# Patient Record
Sex: Male | Born: 1960 | Race: Black or African American | Hispanic: No | Marital: Married | Smoking: Former smoker
Health system: Southern US, Community
[De-identification: ages and names within clinical notes are randomized; demographics above are authoritative.]

## PROBLEM LIST (undated history)

## (undated) DIAGNOSIS — M5020 Other cervical disc displacement, unspecified cervical region: Secondary | ICD-10-CM

---

## 2016-06-16 ENCOUNTER — Encounter (HOSPITAL_COMMUNITY): Payer: Self-pay | Admitting: Emergency Medicine

## 2016-06-16 ENCOUNTER — Ambulatory Visit (HOSPITAL_COMMUNITY)
Admission: EM | Admit: 2016-06-16 | Discharge: 2016-06-16 | Disposition: A | Discharge status: Home / Self Care | Payer: Managed Care, Other (non HMO) | Attending: Internal Medicine | Admitting: Internal Medicine

## 2016-06-16 DIAGNOSIS — R05 Cough: Secondary | ICD-10-CM

## 2016-06-16 DIAGNOSIS — J208 Acute bronchitis due to other specified organisms: Secondary | ICD-10-CM | POA: Diagnosis not present

## 2016-06-16 DIAGNOSIS — R062 Wheezing: Secondary | ICD-10-CM

## 2016-06-16 DIAGNOSIS — R059 Cough, unspecified: Secondary | ICD-10-CM

## 2016-06-16 HISTORY — DX: Other cervical disc displacement, unspecified cervical region: M50.20

## 2016-06-16 MED ORDER — AZITHROMYCIN 250 MG PO TABS: 250.0000 mg | ORAL_TABLET | Freq: Every day | ORAL | 0 refills | Status: DC

## 2016-06-16 MED ORDER — ALBUTEROL SULFATE HFA 108 (90 BASE) MCG/ACT IN AERS: 2.0000 | INHALATION_SPRAY | RESPIRATORY_TRACT | 0 refills | Status: DC | PRN

## 2016-06-16 MED ORDER — METHYLPREDNISOLONE 4 MG PO TBPK: ORAL_TABLET | ORAL | 0 refills | Status: DC

## 2016-06-16 MED ORDER — BENZONATATE 100 MG PO CAPS: 200.0000 mg | ORAL_CAPSULE | Freq: Three times a day (TID) | ORAL | 0 refills | Status: DC | PRN

## 2016-06-16 NOTE — ED Triage Notes (Signed)
The patient presented to the [facility] with a complaint of a cough and chest congestion x 1 week. 

## 2016-06-16 NOTE — ED Provider Notes (Signed)
CSN: [id number]     Arrival date & time 06/16/16  1822 History   First MD Initiated Contact with Patient 06/16/16 1918     Chief Complaint  Patient presents with  . Cough   (Consider location/radiation/quality/duration/timing/severity/associated sxs/prior Treatment) Patient presents with c/o cough, congestion, wheezing, and uri sx's for over a week.  He quit smoking 3 months ago.   The history is provided by the patient.  URI  Presenting symptoms: congestion, cough, fatigue and rhinorrhea   Presenting symptoms: no sore throat   Severity:  Moderate Onset quality:  Sudden Duration:  1 week Timing:  Constant Progression:  Worsening Chronicity:  New Relieved by:  None tried Worsened by:  Nothing Ineffective treatments:  None tried Associated symptoms: wheezing     Past Medical History:  Diagnosis Date  . Cervical herniated disc    History reviewed. No pertinent surgical history. History reviewed. No pertinent family history. Social History  Substance Use Topics  . Smoking status: Former Games developer  . Smokeless tobacco: Never Used  . Alcohol use Yes    Review of Systems  Constitutional: Positive for fatigue.  HENT: Positive for congestion and rhinorrhea. Negative for sore throat.   Eyes: Negative.   Respiratory: Positive for cough and wheezing.   Cardiovascular: Negative.   Gastrointestinal: Negative.   Endocrine: Negative.   Genitourinary: Negative.   Musculoskeletal: Negative.   Allergic/Immunologic: Negative.   Neurological: Negative.   Hematological: Negative.   Psychiatric/Behavioral: Negative.     Allergies  Patient has no known allergies.  Home Medications   Prior to Admission medications   Medication Sig Start Date End Date Taking? Authorizing Provider  albuterol (PROVENTIL HFA;VENTOLIN HFA) 108 (90 Base) MCG/ACT inhaler Inhale 2 puffs into the lungs every 4 (four) hours as needed for wheezing or shortness of breath. 06/16/16   Deatra Canter, FNP   azithromycin (ZITHROMAX) 250 MG tablet Take 1 tablet (250 mg total) by mouth daily. Take first 2 tablets together, then 1 every day until finished. 06/16/16   Deatra Canter, FNP  benzonatate (TESSALON) 100 MG capsule Take 2 capsules (200 mg total) by mouth 3 (three) times daily as needed for cough. 06/16/16   Deatra Canter, FNP  methylPREDNISolone (MEDROL DOSEPAK) 4 MG TBPK tablet Take 6-5-4-3-2-1 po qd 06/16/16   Deatra Canter, FNP   Meds Ordered and Administered this Visit  Medications - No data to display  BP (!) 162/83 (BP Location: Right Arm)   Pulse 80   Temp 98.5 F (36.9 C) (Oral)   Resp 16   SpO2 97%  No data found.   Physical Exam  Constitutional: He is oriented to person, place, and time. He appears well-developed and well-nourished.  HENT:  Head: Normocephalic and atraumatic.  Right Ear: External ear normal.  Left Ear: External ear normal.  Mouth/Throat: Oropharynx is clear and moist.  Eyes: Conjunctivae and EOM are normal. Pupils are equal, round, and reactive to light.  Neck: Normal range of motion. Neck supple.  Cardiovascular: Normal rate, regular rhythm and normal heart sounds.   Pulmonary/Chest: Effort normal. He has wheezes.  Abdominal: Soft. Bowel sounds are normal.  Musculoskeletal: Normal range of motion.  Neurological: He is alert and oriented to person, place, and time.  Nursing note and vitals reviewed.   Urgent Care Course     Procedures (including critical care time)  Labs Review Labs Reviewed - No data to display  Imaging Review No results found.   Visual Acuity Review  Right Eye Distance:   Left Eye Distance:   Bilateral Distance:    Right Eye Near:   Left Eye Near:    Bilateral Near:         MDM   1. Acute bronchitis due to other specified organisms   2. Cough   3. Wheezing    Medrol dose pack as directed  #21 Zpak Tessalon Perles Albuterol MDI  Push po fluids, rest, tylenol and motrin otc prn as directed for  fever, arthralgias, and myalgias.  Follow up prn if sx's continue or persist.    Deatra Canter, FNP 06/16/16 [phone]

## 2018-09-27 ENCOUNTER — Other Ambulatory Visit: Payer: Self-pay

## 2018-09-27 ENCOUNTER — Encounter (HOSPITAL_COMMUNITY): Payer: Self-pay

## 2018-09-27 ENCOUNTER — Emergency Department (HOSPITAL_COMMUNITY): Payer: No Typology Code available for payment source

## 2018-09-27 ENCOUNTER — Emergency Department (HOSPITAL_COMMUNITY)
Admission: EM | Admit: 2018-09-27 | Discharge: 2018-09-27 | Disposition: A | Discharge status: Home / Self Care | Payer: No Typology Code available for payment source | Attending: Emergency Medicine | Admitting: Emergency Medicine

## 2018-09-27 DIAGNOSIS — M25512 Pain in left shoulder: Secondary | ICD-10-CM | POA: Insufficient documentation

## 2018-09-27 DIAGNOSIS — Z87891 Personal history of nicotine dependence: Secondary | ICD-10-CM | POA: Insufficient documentation

## 2018-09-27 MED ORDER — IBUPROFEN 400 MG PO TABS
600.0000 mg | ORAL_TABLET | Freq: Once | ORAL | Status: AC
  Administered 2018-09-27: 600 mg via ORAL
  Filled 2018-09-27: qty 1

## 2018-09-27 NOTE — ED Triage Notes (Signed)
Pt reports he was "slinging trash' while at work today and heard a pop in his left shoulder, possible dislocation to shoulder. +ROM and pulses palpable.

## 2018-09-27 NOTE — ED Provider Notes (Signed)
[facility] DEPARTMENT Provider Note   CSN: [id number] Arrival date & time: 09/27/18  1850    History   Chief Complaint Chief Complaint  Patient presents with  . Shoulder Injury    HPI Chad Cantrell is a 58 y.o. male with a PMH of a cervical herniated disc presents with left shoulder pain. Patient reports pain started after throwing trash at work. Patient describes pain as an ache and states it is worse with movement. Patient reports pain is very mild at this time. Patient states rest makes symptoms better. Patient denies numbness, paresthesias, weakness, fever, chills, nausea, vomiting, or abdominal pain. Patient denies neck pain, elbow pain, wrist pain, or hand pain. Patient denies joint edema. Patient denies any previous surgeries.      HPI  Past Medical History:  Diagnosis Date  . Cervical herniated disc     There are no active problems to display for this patient.   History reviewed. No pertinent surgical history.      Home Medications    Prior to Admission medications   Medication Sig Start Date End Date Taking? Authorizing Provider  albuterol (PROVENTIL HFA;VENTOLIN HFA) 108 (90 Base) MCG/ACT inhaler Inhale 2 puffs into the lungs every 4 (four) hours as needed for wheezing or shortness of breath. 06/16/16   Lysbeth Penner, FNP  azithromycin (ZITHROMAX) 250 MG tablet Take 1 tablet (250 mg total) by mouth daily. Take first 2 tablets together, then 1 every day until finished. 06/16/16   Lysbeth Penner, FNP  benzonatate (TESSALON) 100 MG capsule Take 2 capsules (200 mg total) by mouth 3 (three) times daily as needed for cough. 06/16/16   Lysbeth Penner, FNP  methylPREDNISolone (MEDROL DOSEPAK) 4 MG TBPK tablet Take 6-5-4-3-2-1 po qd 06/16/16   Lysbeth Penner, FNP    Family History No family history on file.  Social History Social History   Tobacco Use  . Smoking status: Former Research scientist (life sciences)  . Smokeless tobacco: Never Used  Substance  Use Topics  . Alcohol use: Yes  . Drug use: No     Allergies   Patient has no known allergies.   Review of Systems Review of Systems  Constitutional: Negative for activity change, chills, diaphoresis and fever.  Respiratory: Negative for shortness of breath.   Cardiovascular: Negative for leg swelling.  Gastrointestinal: Negative for abdominal pain, diarrhea, nausea and vomiting.  Musculoskeletal: Positive for arthralgias. Negative for back pain, gait problem, joint swelling and myalgias.  Skin: Negative for color change, rash and wound.  Allergic/Immunologic: Negative for immunocompromised state.  Neurological: Negative for weakness and numbness.  Hematological: Does not bruise/bleed easily.     Physical Exam Updated Vital Signs BP 140/87 (BP Location: Right Arm)   Pulse 78   Temp 98.2 F (36.8 C) (Oral)   Resp 18   SpO2 96%   Physical Exam Vitals signs and nursing note reviewed.  Constitutional:      General: He is not in acute distress.    Appearance: He is well-developed. He is not diaphoretic.  HENT:     Head: Normocephalic and atraumatic.  Neck:     Musculoskeletal: Normal range of motion.  Cardiovascular:     Rate and Rhythm: Normal rate and regular rhythm.     Heart sounds: Normal heart sounds. No murmur. No friction rub. No gallop.   Pulmonary:     Effort: Pulmonary effort is normal. No respiratory distress.     Breath sounds: Normal breath sounds. No  wheezing or rales.  Abdominal:     Palpations: Abdomen is soft.     Tenderness: There is no abdominal tenderness.  Musculoskeletal:     Right shoulder: Normal. He exhibits normal range of motion, no tenderness and no bony tenderness.     Left shoulder: He exhibits decreased range of motion, tenderness and bony tenderness. He exhibits no swelling, no effusion, no crepitus, no deformity, no laceration, no pain, no spasm, normal pulse and normal strength.     Left elbow: Normal. He exhibits normal range of  motion, no swelling and no effusion.     Left wrist: Normal. He exhibits normal range of motion, no tenderness and no bony tenderness.     Cervical back: Normal. He exhibits normal range of motion, no tenderness and no bony tenderness.     Comments: No skin changes noted. Anterior shoulder tenderness to palpation. Decreased ROM due to pain. Negative Hawkins and negative drop arm test. 2+ radial pulses. Sensation intact. 5/5 strength in upper extremities.   Skin:    General: Skin is warm.     Findings: No erythema or rash.  Neurological:     Mental Status: He is alert.      ED Treatments / Results  Labs (all labs ordered are listed, but only abnormal results are displayed) Labs Reviewed - No data to display  EKG None  Radiology Dg Shoulder Left  Result Date: 09/27/2018 CLINICAL DATA:  Shoulder pain following heavy lifting, initial encounter EXAM: LEFT SHOULDER - 2+ VIEW COMPARISON:  None. FINDINGS: Mild degenerative changes of the acromioclavicular joint are seen. No acute fracture or dislocation is noted. No soft tissue abnormality is seen. IMPRESSION: No acute abnormality noted. Electronically Signed   By: Alcide CleverMark  Lukens M.D.   On: 09/27/2018 19:28    Procedures Procedures (including critical care time)  Medications Ordered in ED Medications  ibuprofen (ADVIL) tablet 600 mg (600 mg Oral Given 09/27/18 2103)     Initial Impression / Assessment and Plan / ED Course  I have reviewed the triage vital signs and the nursing notes.  Pertinent labs & imaging results that were available during my care of the patient were reviewed by me and considered in my medical decision making (see chart for details).  Clinical Course as of Sep 27 2123  Mon Sep 27, 2018  2021 Mild degenerative changes of the acromioclavicular joint are seen. No acute fracture or dislocation is noted. No soft tissue abnormality is seen.    DG Shoulder Left [AH]    Clinical Course User Index [AH] Leretha DykesHernandez,  Donelda Mailhot P, PA-C      Patient presents with shoulder pain. Patient X-Ray negative for obvious fracture or dislocation. Pain managed in ED. Pt advised to follow up with orthopedics if symptoms persist for possibility of missed fracture diagnosis. Patient given brace while in ED, conservative therapy recommended and discussed. Patient will be dc home & is agreeable with above plan.  Final Clinical Impressions(s) / ED Diagnoses   Final diagnoses:  Acute pain of left shoulder    ED Discharge Orders    None       Glade StanfordHernandez, Dashiell Franchino P, PA-C 09/27/18 2125    Gwyneth SproutPlunkett, Whitney, MD 09/28/18 0002

## 2018-09-27 NOTE — Discharge Instructions (Signed)
1. Medications: alternate ibuprofen and tylenol for pain control, usual home medications 2. Treatment: rest, ice, elevate and use brace, drink plenty of fluids, gentle stretching 3. Follow Up: Please follow up with orthopedics as directed or your PCP in 1 week if no improvement for discussion of your diagnoses and further evaluation after today's visit; if you do not have a primary care doctor use the resource guide provided to find one; Please return to the ER for worsening symptoms or other concerns.  You should return to the ER if you develop severe or worsening symptoms.   Emergency Department Resource Guide 1) Find a Doctor and Pay Out of Pocket Although you won't have to find out who is covered by your insurance plan, it is a good idea to ask around and get recommendations. You will then need to call the office and see if the doctor you have chosen will accept you as a new patient and what types of options they offer for patients who are self-pay. Some doctors offer discounts or will set up payment plans for their patients who do not have insurance, but you will need to ask so you aren't surprised when you get to your appointment.  2) Contact Your Local Health Department Not all health departments have doctors that can see patients for sick visits, but many do, so it is worth a call to see if yours does. If you don't know where your local health department is, you can check in your phone book. The CDC also has a tool to help you locate your state's health department, and many state websites also have listings of all of their local health departments.  3) Find a [facility] If your illness is not likely to be very severe or complicated, you may want to try a walk in clinic. These are popping up all over the country in pharmacies, drugstores, and shopping centers. They're usually staffed by nurse practitioners or physician assistants that have been trained to treat common illnesses and  complaints. They're usually fairly quick and inexpensive. However, if you have serious medical issues or chronic medical problems, these are probably not your best option.  No Primary Care Doctor: Call Health Connect at  [phone] - they can help you locate a primary care doctor that  accepts your insurance, provides certain services, etc. Physician Referral Service- [phone]  Chronic Pain Problems: Organization         Address  Phone   Notes  [facility]  [phone] Patients need to be referred by their primary care doctor.   Medication Assistance: Organization         Address  Phone   Notes  [facility] Medication [facility] [address] [place], [place] [address] [phone] --Must be a resident of [facility] -- Must have NO insurance coverage whatsoever (no Medicaid/ Medicare, etc.) -- The pt. MUST have a primary care doctor that directs their care regularly and follows them in the community   [facility]  [phone]   [employer]  [phone]    Agencies that provide inexpensive medical care: Organization         Address  Phone   Notes  [place]  [phone]   [facility]    [phone]   [facility] [place], [place] [address] [phone]   [place] [phone])  [phone]   [facility]    [phone]   [facility]    [phone]) [phone]   [facility]  [address], [place] Phone:  [phone], Fax:  [phone]) [phone] Hours of Operation:  9 am - 6 pm, M-F.  Also accepts Medicaid/Medicare and self-pay.  [facility]  [address], [place] Phone: [phone]) [phone], Fax: [phone]. Hours of Operation:  8:30 am - 5:30 pm, M-F.  Also accepts Medicaid and self-pay.  [facility] [place] [address],  [place] Phone: [phone]) [phone]   [facility] [address], [place], [place] [phone], Ext. [phone] Mondays & Thursdays: 7-9 AM.  First 15 patients are seen on a first come, first serve basis.    Medicaid-accepting [facility] Providers:  Organization         Address  Phone   Notes  [facility] [address], [place] [phone] Also accepts self-pay patients.  [facility] [address], [place]  [phone]   [facility] [address], [place] [phone]) [phone]   [facility] [address] [place] [address], [place] [phone]   Renaye RakersVeita Bland [address], [place]   [phone] Only accepts [place] patients after they have their name applied to their card.   Self-Pay (no insurance) in [facility] [place]:  Organization         Address  Phone   Notes  Sickle Cell Patients, [facility] [address], [place] [phone]) [phone]   [facility] [address], [place] [phone]) [phone]   [facility] [place]  [address], [place] [phone]   [facility]/Dr. Osei-Bonsu  [address] [place] [address], [place] or [phone], [place] [phone] Phone number for both [place] and [place] locations is the same.  [facility] [address], [place] [phone]) [phone]   [facility] [place] [address] [place] [address], [place] or [address] [phone]   [facility] [address], [place] [phone]) [phone], phone; [phone], fax Sees patients 1st and 3rd Saturday of every month.  Must not qualify for public or private insurance (i.e. Medicaid, Medicare, [place] Health Choice, Veterans' Benefits)  Household income should be no more than 200% of the poverty level The  clinic cannot treat you if you are pregnant or think you are pregnant  Sexually transmitted diseases are not treated at the clinic.

## 2018-09-27 NOTE — ED Notes (Signed)
Pt verbalized understanding of d/c instructions, follow up and medications. Pt ambulatory to waiting room with steady gait, shoulder immobilizer in place.

## 2018-11-08 DIAGNOSIS — M19012 Primary osteoarthritis, left shoulder: Secondary | ICD-10-CM | POA: Insufficient documentation

## 2018-12-09 DIAGNOSIS — M7522 Bicipital tendinitis, left shoulder: Secondary | ICD-10-CM | POA: Insufficient documentation

## 2019-02-02 HISTORY — PX: SHOULDER FUSION: SUR625

## 2019-04-11 ENCOUNTER — Other Ambulatory Visit: Payer: Self-pay

## 2019-04-12 ENCOUNTER — Ambulatory Visit (INDEPENDENT_AMBULATORY_CARE_PROVIDER_SITE_OTHER): Payer: Managed Care, Other (non HMO) | Admitting: Family Medicine

## 2019-04-12 ENCOUNTER — Encounter: Payer: Self-pay | Admitting: Family Medicine

## 2019-04-12 VITALS — BP 148/92 | HR 78 | Temp 97.8°F | Ht 72.0 in | Wt 188.8 lb

## 2019-04-12 DIAGNOSIS — Z0000 Encounter for general adult medical examination without abnormal findings: Secondary | ICD-10-CM | POA: Diagnosis not present

## 2019-04-12 DIAGNOSIS — R030 Elevated blood-pressure reading, without diagnosis of hypertension: Secondary | ICD-10-CM

## 2019-04-12 DIAGNOSIS — M654 Radial styloid tenosynovitis [de Quervain]: Secondary | ICD-10-CM | POA: Insufficient documentation

## 2019-04-12 DIAGNOSIS — Z23 Encounter for immunization: Secondary | ICD-10-CM | POA: Diagnosis not present

## 2019-04-12 DIAGNOSIS — N529 Male erectile dysfunction, unspecified: Secondary | ICD-10-CM

## 2019-04-12 LAB — URINALYSIS, ROUTINE W REFLEX MICROSCOPIC
Bilirubin Urine: NEGATIVE
Ketones, ur: NEGATIVE
Leukocytes,Ua: NEGATIVE
Nitrite: NEGATIVE
Specific Gravity, Urine: >=1.03 — AB (ref 1.000–1.030)
Urine Glucose: NEGATIVE
Urobilinogen, UA: 0.2 (ref 0.0–1.0)
pH: 5 (ref 5.0–8.0)

## 2019-04-12 LAB — COMPREHENSIVE METABOLIC PANEL
ALT: 110 U/L — ABNORMAL HIGH (ref 0–53)
AST: 119 U/L — ABNORMAL HIGH (ref 0–37)
Albumin: 4.7 g/dL (ref 3.5–5.2)
Alkaline Phosphatase: 107 U/L (ref 39–117)
BUN: 12 mg/dL (ref 6–23)
CO2: 21 mEq/L (ref 19–32)
Calcium: 9.8 mg/dL (ref 8.4–10.5)
Chloride: 99 mEq/L (ref 96–112)
Creatinine, Ser: 1.11 mg/dL (ref 0.40–1.50)
GFR: 81.92 mL/min (ref >60.00)
Glucose, Bld: 92 mg/dL (ref 70–99)
Potassium: 3.8 mEq/L (ref 3.5–5.1)
Sodium: 134 mEq/L — ABNORMAL LOW (ref 135–145)
Total Bilirubin: 1.4 mg/dL — ABNORMAL HIGH (ref 0.2–1.2)
Total Protein: 7 g/dL (ref 6.0–8.3)

## 2019-04-12 LAB — LIPID PANEL
Cholesterol: 341 mg/dL — ABNORMAL HIGH (ref 0–200)
HDL: 31.1 mg/dL — ABNORMAL LOW (ref >39.00)
Total CHOL/HDL Ratio: 11
Triglycerides: 1709 mg/dL — ABNORMAL HIGH (ref 0.0–149.0)

## 2019-04-12 LAB — CBC
HCT: 44.9 % (ref 39.0–52.0)
Hemoglobin: 15.3 g/dL (ref 13.0–17.0)
MCHC: 34 g/dL (ref 30.0–36.0)
MCV: 89 fl (ref 78.0–100.0)
Platelets: 258 10*3/uL (ref 150.0–400.0)
RBC: 5.05 Mil/uL (ref 4.22–5.81)
RDW: 13.3 % (ref 11.5–15.5)
WBC: 8.2 10*3/uL (ref 4.0–10.5)

## 2019-04-12 LAB — PSA: PSA: 4.28 ng/mL — ABNORMAL HIGH (ref 0.10–4.00)

## 2019-04-12 LAB — LDL CHOLESTEROL, DIRECT: Direct LDL: 64 mg/dL

## 2019-04-12 LAB — TSH: TSH: 1.5 u[IU]/mL (ref 0.35–4.50)

## 2019-04-12 MED ORDER — SILDENAFIL CITRATE 20 MG PO TABS: ORAL_TABLET | ORAL | 0 refills | Status: AC

## 2019-04-12 NOTE — Patient Instructions (Signed)
Health Maintenance, Male Adopting a healthy lifestyle and getting preventive care are important in promoting health and wellness. Ask your health care provider about:  The right schedule for you to have regular tests and exams.  Things you can do on your own to prevent diseases and keep yourself healthy. What should I know about diet, weight, and exercise? Eat a healthy diet   Eat a diet that includes plenty of vegetables, fruits, low-fat dairy products, and lean protein.  Do not eat a lot of foods that are high in solid fats, added sugars, or sodium. Maintain a healthy weight Body mass index (BMI) is a measurement that can be used to identify possible weight problems. It estimates body fat based on height and weight. Your health care provider can help determine your BMI and help you achieve or maintain a healthy weight. Get regular exercise Get regular exercise. This is one of the most important things you can do for your health. Most adults should:  Exercise for at least 150 minutes each week. The exercise should increase your heart rate and make you sweat (moderate-intensity exercise).  Do strengthening exercises at least twice a week. This is in addition to the moderate-intensity exercise.  Spend less time sitting. Even light physical activity can be beneficial. Watch cholesterol and blood lipids Have your blood tested for lipids and cholesterol at 60 years of age, then have this test every 5 years. You may need to have your cholesterol levels checked more often if:  Your lipid or cholesterol levels are high.  You are older than 60 years of age.  You are at high risk for heart disease. What should I know about cancer screening? Many types of cancers can be detected early and may often be prevented. Depending on your health history and family history, you may need to have cancer screening at various ages. This may include screening for:  Colorectal cancer.  Prostate  cancer.  Skin cancer.  Lung cancer. What should I know about heart disease, diabetes, and high blood pressure? Blood pressure and heart disease  High blood pressure causes heart disease and increases the risk of stroke. This is more likely to develop in people who have high blood pressure readings, are of African descent, or are overweight.  Talk with your health care provider about your target blood pressure readings.  Have your blood pressure checked: ? Every 3-5 years if you are 18-39 years of age. ? Every year if you are 40 years old or older.  If you are between the ages of 65 and 75 and are a current or former smoker, ask your health care provider if you should have a one-time screening for abdominal aortic aneurysm (AAA). Diabetes Have regular diabetes screenings. This checks your fasting blood sugar level. Have the screening done:  Once every three years after age 45 if you are at a normal weight and have a low risk for diabetes.  More often and at a younger age if you are overweight or have a high risk for diabetes. What should I know about preventing infection? Hepatitis B If you have a higher risk for hepatitis B, you should be screened for this virus. Talk with your health care provider to find out if you are at risk for hepatitis B infection. Hepatitis C Blood testing is recommended for:  Everyone born from 1945 through 1965.  Anyone with known risk factors for hepatitis C. Sexually transmitted infections (STIs)  You should be screened each year   for STIs, including gonorrhea and chlamydia, if: ? You are sexually active and are younger than 60 years of age. ? You are older than 60 years of age and your health care provider tells you that you are at risk for this type of infection. ? Your sexual activity has changed since you were last screened, and you are at increased risk for chlamydia or gonorrhea. Ask your health care provider if you are at risk.  Ask your  health care provider about whether you are at high risk for HIV. Your health care provider may recommend a prescription medicine to help prevent HIV infection. If you choose to take medicine to prevent HIV, you should first get tested for HIV. You should then be tested every 3 months for as long as you are taking the medicine. Follow these instructions at home: Lifestyle  Do not use any products that contain nicotine or tobacco, such as cigarettes, e-cigarettes, and chewing tobacco. If you need help quitting, ask your health care provider.  Do not use street drugs.  Do not share needles.  Ask your health care provider for help if you need support or information about quitting drugs. Alcohol use  Do not drink alcohol if your health care provider tells you not to drink.  If you drink alcohol: ? Limit how much you have to 0-2 drinks a day. ? Be aware of how much alcohol is in your drink. In the [place] one drink equals one 12 oz bottle of beer (355 mL), one 5 oz glass of wine (148 mL), or one 1 oz glass of hard liquor (44 mL). General instructions  Schedule regular health, dental, and eye exams.  Stay current with your vaccines.  Tell your health care provider if: ? You often feel depressed. ? You have ever been abused or do not feel safe at home. Summary  Adopting a healthy lifestyle and getting preventive care are important in promoting health and wellness.  Follow your health care provider's instructions about healthy diet, exercising, and getting tested or screened for diseases.  Follow your health care provider's instructions on monitoring your cholesterol and blood pressure. This information is not intended to replace advice given to you by your health care provider. Make sure you discuss any questions you have with your health care provider. Document Revised: 02/17/2018 Document Reviewed: 02/17/2018 Elsevier Patient Education  [place].  Erectile  Dysfunction Erectile dysfunction (ED) is the inability to get or keep an erection in order to have sexual intercourse. Erectile dysfunction may include:  Inability to get an erection.  Lack of enough hardness of the erection to allow penetration.  Loss of the erection before sex is finished. What are the causes? This condition may be caused by:  Certain medicines, such as: ? Pain relievers. ? Antihistamines. ? Antidepressants. ? Blood pressure medicines. ? Water pills (diuretics). ? Ulcer medicines. ? Muscle relaxants. ? Drugs.  Excessive drinking.  Psychological causes, such as: ? Anxiety. ? Depression. ? Sadness. ? Exhaustion. ? Performance fear. ? Stress.  Physical causes, such as: ? Artery problems. This may include diabetes, smoking, liver disease, or atherosclerosis. ? High blood pressure. ? Hormonal problems, such as low testosterone. ? Obesity. ? Nerve problems. This may include back or pelvic injuries, diabetes mellitus, multiple sclerosis, or Parkinson disease. What are the signs or symptoms? Symptoms of this condition include:  Inability to get an erection.  Lack of enough hardness of the erection to allow penetration.  Loss of the  erection before sex is finished.  Normal erections at some times, but with frequent unsatisfactory episodes.  Low sexual satisfaction in either partner due to erection problems.  A curved penis occurring with erection. The curve may cause pain or the penis may be too curved to allow for intercourse.  Never having nighttime erections. How is this diagnosed? This condition is often diagnosed by:  Performing a physical exam to find other diseases or specific problems with the penis.  Asking you detailed questions about the problem.  Performing blood tests to check for diabetes mellitus or to measure hormone levels.  Performing other tests to check for underlying health conditions.  Performing an ultrasound exam to  check for scarring.  Performing a test to check blood flow to the penis.  Doing a sleep study at home to measure nighttime erections. How is this treated? This condition may be treated by:  Medicine taken by mouth to help you achieve an erection (oral medicine).  Hormone replacement therapy to replace low testosterone levels.  Medicine that is injected into the penis. Your health care provider may instruct you how to give yourself these injections at home.  Vacuum pump. This is a pump with a ring on it. The pump and ring are placed on the penis and used to create pressure that helps the penis become erect.  Penile implant surgery. In this procedure, you may receive: ? An inflatable implant. This consists of cylinders, a pump, and a reservoir. The cylinders can be inflated with a fluid that helps to create an erection, and they can be deflated after intercourse. ? A semi-rigid implant. This consists of two silicone rubber rods. The rods provide some rigidity. They are also flexible, so the penis can both curve downward in its normal position and become straight for sexual intercourse.  Blood vessel surgery, to improve blood flow to the penis. During this procedure, a blood vessel from a different part of the body is placed into the penis to allow blood to flow around (bypass) damaged or blocked blood vessels.  Lifestyle changes, such as exercising more, losing weight, and quitting smoking. Follow these instructions at home: Medicines   Take over-the-counter and prescription medicines only as told by your health care provider. Do not increase the dosage without first discussing it with your health care provider.  If you are using self-injections, perform injections as directed by your health care provider. Make sure to avoid any veins that are on the surface of the penis. After giving an injection, apply pressure to the injection site for 5 minutes. General instructions  Exercise  regularly, as directed by your health care provider. Work with your health care provider to lose weight, if needed.  Do not use any products that contain nicotine or tobacco, such as cigarettes and e-cigarettes. If you need help quitting, ask your health care provider.  Before using a vacuum pump, read the instructions that come with the pump and discuss any questions with your health care provider.  Keep all follow-up visits as told by your health care provider. This is important. Contact a health care provider if:  You feel nauseous.  You vomit. Get help right away if:  You are taking oral or injectable medicines and you have an erection that lasts longer than 4 hours. If your health care provider is unavailable, go to the nearest emergency room for evaluation. An erection that lasts much longer than 4 hours can result in permanent damage to your penis.  You have severe pain in your groin or abdomen.  You develop redness or severe swelling of your penis.  You have redness spreading up into your groin or lower abdomen.  You are unable to urinate.  You experience chest pain or a rapid heart beat (palpitations) after taking oral medicines. Summary  Erectile dysfunction (ED) is the inability to get or keep an erection during sexual intercourse. This problem can usually be treated successfully.  This condition is diagnosed based on a physical exam, your symptoms, and tests to determine the cause. Treatment varies depending on the cause, and may include medicines, hormone therapy, surgery, or vacuum pump.  You may need follow-up visits to make sure that you are using your medicines or devices correctly.  Get help right away if you are taking or injecting medicines and you have an erection that lasts longer than 4 hours. This information is not intended to replace advice given to you by your health care provider. Make sure you discuss any questions you have with your health care  provider. Document Revised: 02/06/2017 Document Reviewed: 03/12/2016 Elsevier Patient Education  [place].  Hypertension, Adult Hypertension is another name for high blood pressure. High blood pressure forces your heart to work harder to pump blood. This can cause problems over time. There are two numbers in a blood pressure reading. There is a top number (systolic) over a bottom number (diastolic). It is best to have a blood pressure that is below 120/80. Healthy choices can help lower your blood pressure, or you may need medicine to help lower it. What are the causes? The cause of this condition is not known. Some conditions may be related to high blood pressure. What increases the risk?  Smoking.  Having type 2 diabetes mellitus, high cholesterol, or both.  Not getting enough exercise or physical activity.  Being overweight.  Having too much fat, sugar, calories, or salt (sodium) in your diet.  Drinking too much alcohol.  Having long-term (chronic) kidney disease.  Having a family history of high blood pressure.  Age. Risk increases with age.  Race. You may be at higher risk if you are African American.  Gender. Men are at higher risk than women before age 36. After age 44, women are at higher risk than men.  Having obstructive sleep apnea.  Stress. What are the signs or symptoms?  High blood pressure may not cause symptoms. Very high blood pressure (hypertensive crisis) may cause: ? Headache. ? Feelings of worry or nervousness (anxiety). ? Shortness of breath. ? Nosebleed. ? A feeling of being sick to your stomach (nausea). ? Throwing up (vomiting). ? Changes in how you see. ? Very bad chest pain. ? Seizures. How is this treated?  This condition is treated by making healthy lifestyle changes, such as: ? Eating healthy foods. ? Exercising more. ? Drinking less alcohol.  Your health care provider may prescribe medicine if lifestyle changes are not  enough to get your blood pressure under control, and if: ? Your top number is above 130. ? Your bottom number is above 80.  Your personal target blood pressure may vary. Follow these instructions at home: Eating and drinking   If told, follow the DASH eating plan. To follow this plan: ? Fill one half of your plate at each meal with fruits and vegetables. ? Fill one fourth of your plate at each meal with whole grains. Whole grains include whole-wheat pasta, brown rice, and whole-grain bread. ? Eat or drink  low-fat dairy products, such as skim milk or low-fat yogurt. ? Fill one fourth of your plate at each meal with low-fat (lean) proteins. Low-fat proteins include fish, chicken without skin, eggs, beans, and tofu. ? Avoid fatty meat, cured and processed meat, or chicken with skin. ? Avoid pre-made or processed food.  Eat less than 1,500 mg of salt each day.  Do not drink alcohol if: ? Your doctor tells you not to drink. ? You are pregnant, may be pregnant, or are planning to become pregnant.  If you drink alcohol: ? Limit how much you use to:  0-1 drink a day for women.  0-2 drinks a day for men. ? Be aware of how much alcohol is in your drink. In the [place] one drink equals one 12 oz bottle of beer (355 mL), one 5 oz glass of wine (148 mL), or one 1 oz glass of hard liquor (44 mL). Lifestyle   Work with your doctor to stay at a healthy weight or to lose weight. Ask your doctor what the best weight is for you.  Get at least 30 minutes of exercise most days of the week. This may include walking, swimming, or biking.  Get at least 30 minutes of exercise that strengthens your muscles (resistance exercise) at least 3 days a week. This may include lifting weights or doing Pilates.  Do not use any products that contain nicotine or tobacco, such as cigarettes, e-cigarettes, and chewing tobacco. If you need help quitting, ask your doctor.  Check your blood pressure at home as told by  your doctor.  Keep all follow-up visits as told by your doctor. This is important. Medicines  Take over-the-counter and prescription medicines only as told by your doctor. Follow directions carefully.  Do not skip doses of blood pressure medicine. The medicine does not work as well if you skip doses. Skipping doses also puts you at risk for problems.  Ask your doctor about side effects or reactions to medicines that you should watch for. Contact a doctor if you:  Think you are having a reaction to the medicine you are taking.  Have headaches that keep coming back (recurring).  Feel dizzy.  Have swelling in your ankles.  Have trouble with your vision. Get help right away if you:  Get a very bad headache.  Start to feel mixed up (confused).  Feel weak or numb.  Feel faint.  Have very bad pain in your: ? Chest. ? Belly (abdomen).  Throw up more than once.  Have trouble breathing. Summary  Hypertension is another name for high blood pressure.  High blood pressure forces your heart to work harder to pump blood.  For most people, a normal blood pressure is less than 120/80.  Making healthy choices can help lower blood pressure. If your blood pressure does not get lower with healthy choices, you may need to take medicine. This information is not intended to replace advice given to you by your health care provider. Make sure you discuss any questions you have with your health care provider. Document Revised: 11/04/2017 Document Reviewed: 11/04/2017 Elsevier Patient Education  [place].  Preventing Hypertension Hypertension, commonly called high blood pressure, is when the force of blood pumping through the arteries is too strong. Arteries are blood vessels that carry blood from the heart throughout the body. Over time, hypertension can damage the arteries and decrease blood flow to important parts of the body, including the brain, heart, and kidneys. Often,  hypertension does not cause symptoms until blood pressure is very high. For this reason, it is important to have your blood pressure checked on a regular basis. Hypertension can often be prevented with diet and lifestyle changes. If you already have hypertension, you can control it with diet and lifestyle changes, as well as medicine. What nutrition changes can be made? Maintain a healthy diet. This includes:  Eating less salt (sodium). Ask your health care provider how much sodium is safe for you to have. The general recommendation is to consume less than 1 tsp (2,300 mg) of sodium a day. ? Do not add salt to your food. ? Choose low-sodium options when grocery shopping and eating out.  Limiting fats in your diet. You can do this by eating low-fat or fat-free dairy products and by eating less red meat.  Eating more fruits, vegetables, and whole grains. Make a goal to eat: ? 1-2 cups of fresh fruits and vegetables each day. ? 3-4 servings of whole grains each day.  Avoiding foods and beverages that have added sugars.  Eating fish that contain healthy fats (omega-3 fatty acids), such as mackerel or salmon. If you need help putting together a healthy eating plan, try the DASH diet. This diet is high in fruits, vegetables, and whole grains. It is low in sodium, red meat, and added sugars. DASH stands for Dietary Approaches to Stop Hypertension. What lifestyle changes can be made?   Lose weight if you are overweight. Losing just 3?5% of your body weight can help prevent or control hypertension. ? For example, if your present weight is 200 lb (91 kg), a loss of 3-5% of your weight means losing 6-10 lb (2.7-4.5 kg). ? Ask your health care provider to help you with a diet and exercise plan to safely lose weight.  Get enough exercise. Do at least 150 minutes of moderate-intensity exercise each week. ? You could do this in short exercise sessions several times a day, or you could do longer exercise  sessions a few times a week. For example, you could take a brisk 10-minute walk or bike ride, 3 times a day, for 5 days a week.  Find ways to reduce stress, such as exercising, meditating, listening to music, or taking a yoga class. If you need help reducing stress, ask your health care provider.  Do not smoke. This includes e-cigarettes. Chemicals in tobacco and nicotine products raise your blood pressure each time you smoke. If you need help quitting, ask your health care provider.  Avoid alcohol. If you drink alcohol, limit alcohol intake to no more than 1 drink a day for nonpregnant women and 2 drinks a day for men. One drink equals 12 oz of beer, 5 oz of wine, or 1 oz of hard liquor. Why are these changes important? Diet and lifestyle changes can help you prevent hypertension, and they may make you feel better overall and improve your quality of life. If you have hypertension, making these changes will help you control it and help prevent major complications, such as:  Hardening and narrowing of arteries that supply blood to: ? Your heart. This can cause a heart attack. ? Your brain. This can cause a stroke. ? Your kidneys. This can cause kidney failure.  Stress on your heart muscle, which can cause heart failure. What can I do to lower my risk?  Work with your health care provider to make a hypertension prevention plan that works for you. Follow your plan and keep  all follow-up visits as told by your health care provider.  Learn how to check your blood pressure at home. Make sure that you know your personal target blood pressure, as told by your health care provider. How is this treated? In addition to diet and lifestyle changes, your health care provider may recommend medicines to help lower your blood pressure. You may need to try a few different medicines to find what works best for you. You also may need to take more than one medicine. Take over-the-counter and prescription medicines  only as told by your health care provider. Where to find support Your health care provider can help you prevent hypertension and help you keep your blood pressure at a healthy level. Your local hospital or your community may also provide support services and prevention programs. The American Heart Association offers an online support network at: CheapBootlegs.com.cy Where to find more information Learn more about hypertension from:  [place]: ElectronicHangman.is  Centers for Disease Control and Prevention: https://ingram.com/  American Academy of Family Physicians: http://familydoctor.org/familydoctor/en/diseases-conditions/high-blood-pressure.printerview.all.html Learn more about the DASH diet from:  [place], Lung, and [place]: https://www.reyes.com/ Contact a health care provider if:  You think you are having a reaction to medicines you have taken.  You have recurrent headaches or feel dizzy.  You have swelling in your ankles.  You have trouble with your vision. Summary  Hypertension often does not cause any symptoms until blood pressure is very high. It is important to get your blood pressure checked regularly.  Diet and lifestyle changes are the most important steps in preventing hypertension.  By keeping your blood pressure in a healthy range, you can prevent complications like heart attack, heart failure, stroke, and kidney failure.  Work with your health care provider to make a hypertension prevention plan that works for you. This information is not intended to replace advice given to you by your health care provider. Make sure you discuss any questions you have with your health care provider. Document Revised: 06/18/2018 Document Reviewed: 11/05/2015 Elsevier Patient Education  [place].  Erectile Dysfunction Erectile  dysfunction (ED) is the inability to get or keep an erection in order to have sexual intercourse. Erectile dysfunction may include:  Inability to get an erection.  Lack of enough hardness of the erection to allow penetration.  Loss of the erection before sex is finished. What are the causes? This condition may be caused by:  Certain medicines, such as: ? Pain relievers. ? Antihistamines. ? Antidepressants. ? Blood pressure medicines. ? Water pills (diuretics). ? Ulcer medicines. ? Muscle relaxants. ? Drugs.  Excessive drinking.  Psychological causes, such as: ? Anxiety. ? Depression. ? Sadness. ? Exhaustion. ? Performance fear. ? Stress.  Physical causes, such as: ? Artery problems. This may include diabetes, smoking, liver disease, or atherosclerosis. ? High blood pressure. ? Hormonal problems, such as low testosterone. ? Obesity. ? Nerve problems. This may include back or pelvic injuries, diabetes mellitus, multiple sclerosis, or Parkinson disease. What are the signs or symptoms? Symptoms of this condition include:  Inability to get an erection.  Lack of enough hardness of the erection to allow penetration.  Loss of the erection before sex is finished.  Normal erections at some times, but with frequent unsatisfactory episodes.  Low sexual satisfaction in either partner due to erection problems.  A curved penis occurring with erection. The curve may cause pain or the penis may be too curved to allow for intercourse.  Never having nighttime erections. How is this diagnosed? This condition is often diagnosed by:  Performing a physical exam to find other diseases or specific problems with the penis.  Asking you detailed questions about the problem.  Performing blood tests to check for diabetes mellitus or to measure hormone levels.  Performing other tests to check for underlying health conditions.  Performing an ultrasound exam to check for  scarring.  Performing a test to check blood flow to the penis.  Doing a sleep study at home to measure nighttime erections. How is this treated? This condition may be treated by:  Medicine taken by mouth to help you achieve an erection (oral medicine).  Hormone replacement therapy to replace low testosterone levels.  Medicine that is injected into the penis. Your health care provider may instruct you how to give yourself these injections at home.  Vacuum pump. This is a pump with a ring on it. The pump and ring are placed on the penis and used to create pressure that helps the penis become erect.  Penile implant surgery. In this procedure, you may receive: ? An inflatable implant. This consists of cylinders, a pump, and a reservoir. The cylinders can be inflated with a fluid that helps to create an erection, and they can be deflated after intercourse. ? A semi-rigid implant. This consists of two silicone rubber rods. The rods provide some rigidity. They are also flexible, so the penis can both curve downward in its normal position and become straight for sexual intercourse.  Blood vessel surgery, to improve blood flow to the penis. During this procedure, a blood vessel from a different part of the body is placed into the penis to allow blood to flow around (bypass) damaged or blocked blood vessels.  Lifestyle changes, such as exercising more, losing weight, and quitting smoking. Follow these instructions at home: Medicines   Take over-the-counter and prescription medicines only as told by your health care provider. Do not increase the dosage without first discussing it with your health care provider.  If you are using self-injections, perform injections as directed by your health care provider. Make sure to avoid any veins that are on the surface of the penis. After giving an injection, apply pressure to the injection site for 5 minutes. General instructions  Exercise regularly, as  directed by your health care provider. Work with your health care provider to lose weight, if needed.  Do not use any products that contain nicotine or tobacco, such as cigarettes and e-cigarettes. If you need help quitting, ask your health care provider.  Before using a vacuum pump, read the instructions that come with the pump and discuss any questions with your health care provider.  Keep all follow-up visits as told by your health care provider. This is important. Contact a health care provider if:  You feel nauseous.  You vomit. Get help right away if:  You are taking oral or injectable medicines and you have an erection that lasts longer than 4 hours. If your health care provider is unavailable, go to the nearest emergency room for evaluation. An erection that lasts much longer than 4 hours can result in permanent damage to your penis.  You have severe pain in your groin or abdomen.  You develop redness or severe swelling of your penis.  You have redness spreading up into your groin or lower abdomen.  You are unable to urinate.  You experience chest pain or a rapid heart beat (palpitations) after taking oral  medicines. Summary  Erectile dysfunction (ED) is the inability to get or keep an erection during sexual intercourse. This problem can usually be treated successfully.  This condition is diagnosed based on a physical exam, your symptoms, and tests to determine the cause. Treatment varies depending on the cause, and may include medicines, hormone therapy, surgery, or vacuum pump.  You may need follow-up visits to make sure that you are using your medicines or devices correctly.  Get help right away if you are taking or injecting medicines and you have an erection that lasts longer than 4 hours. This information is not intended to replace advice given to you by your health care provider. Make sure you discuss any questions you have with your health care provider. Document  Revised: 02/06/2017 Document Reviewed: 03/12/2016 Elsevier Patient Education  2020 Else[employer] Inc.  Preventive Care 69-99 Years Old, Male Preventive care refers to lifestyle choices and visits with your health care provider that can promote health and wellness. This includes:  A yearly physical exam. This is also called an annual well check.  Regular dental and eye exams.  Immunizations.  Screening for certain conditions.  Healthy lifestyle choices, such as eating a healthy diet, getting regular exercise, not using drugs or products that contain nicotine and tobacco, and limiting alcohol use. What can I expect for my preventive care visit? Physical exam Your health care provider will check:  Height and weight. These may be used to calculate body mass index (BMI), which is a measurement that tells if you are at a healthy weight.  Heart rate and blood pressure.  Your skin for abnormal spots. Counseling Your health care provider may ask you questions about:  Alcohol, tobacco, and drug use.  Emotional well-being.  Home and relationship well-being.  Sexual activity.  Eating habits.  Work and work Statistician. What immunizations do I need?  Influenza (flu) vaccine  This is recommended every year. Tetanus, diphtheria, and pertussis (Tdap) vaccine  You may need a Td booster every 10 years. Varicella (chickenpox) vaccine  You may need this vaccine if you have not already been vaccinated. Zoster (shingles) vaccine  You may need this after age 53. Measles, mumps, and rubella (MMR) vaccine  You may need at least one dose of MMR if you were born in 1957 or later. You may also need a second dose. Pneumococcal conjugate (PCV13) vaccine  You may need this if you have certain conditions and were not previously vaccinated. Pneumococcal polysaccharide (PPSV23) vaccine  You may need one or two doses if you smoke cigarettes or if you have certain conditions. Meningococcal  conjugate (MenACWY) vaccine  You may need this if you have certain conditions. Hepatitis A vaccine  You may need this if you have certain conditions or if you travel or work in places where you may be exposed to hepatitis A. Hepatitis B vaccine  You may need this if you have certain conditions or if you travel or work in places where you may be exposed to hepatitis B. Haemophilus influenzae type b (Hib) vaccine  You may need this if you have certain risk factors. Human papillomavirus (HPV) vaccine  If recommended by your health care provider, you may need three doses over 6 months. You may receive vaccines as individual doses or as more than one vaccine together in one shot (combination vaccines). Talk with your health care provider about the risks and benefits of combination vaccines. What tests do I need? Blood tests  Lipid and cholesterol levels.  These may be checked every 5 years, or more frequently if you are over 40 years old.  Hepatitis C test.  Hepatitis B test. Screening  Lung cancer screening. You may have this screening every year starting at age 23 if you have a 30-pack-year history of smoking and currently smoke or have quit within the past 15 years.  Prostate cancer screening. Recommendations will vary depending on your family history and other risks.  Colorectal cancer screening. All adults should have this screening starting at age 72 and continuing until age 6. Your health care provider may recommend screening at age 12 if you are at increased risk. You will have tests every 1-10 years, depending on your results and the type of screening test.  Diabetes screening. This is done by checking your blood sugar (glucose) after you have not eaten for a while (fasting). You may have this done every 1-3 years.  Sexually transmitted disease (STD) testing. Follow these instructions at home: Eating and drinking  Eat a diet that includes fresh fruits and vegetables, whole  grains, lean protein, and low-fat dairy products.  Take vitamin and mineral supplements as recommended by your health care provider.  Do not drink alcohol if your health care provider tells you not to drink.  If you drink alcohol: ? Limit how much you have to 0-2 drinks a day. ? Be aware of how much alcohol is in your drink. In the [place] one drink equals one 12 oz bottle of beer (355 mL), one 5 oz glass of wine (148 mL), or one 1 oz glass of hard liquor (44 mL). Lifestyle  Take daily care of your teeth and gums.  Stay active. Exercise for at least 30 minutes on 5 or more days each week.  Do not use any products that contain nicotine or tobacco, such as cigarettes, e-cigarettes, and chewing tobacco. If you need help quitting, ask your health care provider.  If you are sexually active, practice safe sex. Use a condom or other form of protection to prevent STIs (sexually transmitted infections).  Talk with your health care provider about taking a low-dose aspirin every day starting at age 27. What's next?  Go to your health care provider once a year for a well check visit.  Ask your health care provider how often you should have your eyes and teeth checked.  Stay up to date on all vaccines. This information is not intended to replace advice given to you by your health care provider. Make sure you discuss any questions you have with your health care provider. Document Revised: 02/18/2018 Document Reviewed: 02/18/2018 Elsevier Patient Education  2020 [employer].

## 2019-04-12 NOTE — Progress Notes (Signed)
New Patient Office Visit  Subjective:  Patient ID: Chad Cantrell, male    DOB: 14-Aug-1960  Age: 59 y.o. MRN: [id number]  CC:  Chief Complaint  Patient presents with  . Establish Care    personal concerns pt would like to discuss     HPI Chad Cantrell presents for establishment of care a physical exam and to discuss few problems.  Has been having issues with erectile dysfunction over the last year.  Difficulty achieving erection.  Libido is good.  Has no history of hypertension that he is aware of.  He is currently out of work recuperating from rotator cuff repair on his left shoulder this past November.  Recently moved into this area with his wife to be closer to their children.  He normally works in Production designer, theatre/television/film.  Saw the dentist this past week and had several teeth pulled.  He quit smoking 1 year ago.  He drinks 1-2 beers weekly.  He rarely smokes marijuana.  Had a colonoscopy 5 years ago that did show a polyp.  Past Medical History:  Diagnosis Date  . Cervical herniated disc     Past Surgical History:  Procedure Laterality Date  . SHOULDER FUSION Left 02/02/2019    Family History  Problem Relation Age of Onset  . Kidney disease Mother     Social History   Socioeconomic History  . Marital status: Married    Spouse name: Not on file  . Number of children: Not on file  . Years of education: Not on file  . Highest education level: Not on file  Occupational History  . Not on file  Tobacco Use  . Smoking status: Former Smoker    Types: Cigarettes  . Smokeless tobacco: Never Used  Substance and Sexual Activity  . Alcohol use: Yes    Alcohol/week: 1.0 - 2.0 standard drinks    Types: 1 - 2 Cans of beer per week    Comment: social  . Drug use: Yes    Types: Marijuana  . Sexual activity: Not on file  Other Topics Concern  . Not on file  Social History Narrative  . Not on file   Social Determinants of Health   Financial Resource Strain:   . Difficulty of Paying  Living Expenses: Not on file  Food Insecurity:   . Worried About Programme researcher, broadcasting/film/video in the Last Year: Not on file  . Ran Out of Food in the Last Year: Not on file  Transportation Needs:   . Lack of Transportation (Medical): Not on file  . Lack of Transportation (Non-Medical): Not on file  Physical Activity:   . Days of Exercise per Week: Not on file  . Minutes of Exercise per Session: Not on file  Stress:   . Feeling of Stress : Not on file  Social Connections:   . Frequency of Communication with Friends and Family: Not on file  . Frequency of Social Gatherings with Friends and Family: Not on file  . Attends Religious Services: Not on file  . Active Member of Clubs or Organizations: Not on file  . Attends Banker Meetings: Not on file  . Marital Status: Not on file  Intimate Partner Violence:   . Fear of Current or Ex-Partner: Not on file  . Emotionally Abused: Not on file  . Physically Abused: Not on file  . Sexually Abused: Not on file    ROS Review of Systems  Constitutional: Negative for diaphoresis, fatigue, fever and  unexpected weight change.  HENT: Negative.   Eyes: Negative for photophobia and visual disturbance.  Respiratory: Negative.  Negative for chest tightness and shortness of breath.   Cardiovascular: Negative.   Gastrointestinal: Negative.   Endocrine: Negative for polyphagia and polyuria.  Genitourinary: Negative for difficulty urinating, frequency and urgency.  Musculoskeletal: Positive for arthralgias.  Skin: Negative for pallor.  Allergic/Immunologic: Negative for immunocompromised state.  Neurological: Negative for tremors and speech difficulty.  Hematological: Does not bruise/bleed easily.  Psychiatric/Behavioral: Negative.    Depression screen [facility] 2/9 04/12/2019 04/12/2019  Decreased Interest 0 0  Down, Depressed, Hopeless 0 0  PHQ - 2 Score 0 0  Altered sleeping 0 -  Tired, decreased energy 0 -  Change in appetite 0 -  Feeling bad or  failure about yourself  0 -  Trouble concentrating 0 -  Moving slowly or fidgety/restless 0 -  Suicidal thoughts 0 -  PHQ-9 Score 0 -    Objective:   Today's Vitals: BP (!) 148/92   Pulse 78   Temp 97.8 F (36.6 C) (Tympanic)   Ht 6' (1.829 m)   Wt 188 lb 12.8 oz (85.6 kg)   SpO2 97%   BMI 25.61 kg/m   Physical Exam Constitutional:      General: He is not in acute distress.    Appearance: Normal appearance. He is normal weight. He is not ill-appearing, toxic-appearing or diaphoretic.  HENT:     Head: Normocephalic and atraumatic.     Right Ear: Tympanic membrane, ear canal and external ear normal.     Left Ear: Tympanic membrane, ear canal and external ear normal.  Eyes:     General: No scleral icterus.       Right eye: No discharge.        Left eye: No discharge.     Extraocular Movements: Extraocular movements intact.     Conjunctiva/sclera: Conjunctivae normal.     Pupils: Pupils are equal, round, and reactive to light.  Cardiovascular:     Rate and Rhythm: Normal rate and regular rhythm.  Pulmonary:     Effort: Pulmonary effort is normal.     Breath sounds: Normal breath sounds.  Abdominal:     General: Bowel sounds are normal.  Genitourinary:    Prostate: Enlarged. Not tender and no nodules present.     Rectum: Guaiac result negative. No mass, tenderness, anal fissure, external hemorrhoid or internal hemorrhoid. Normal anal tone.  Musculoskeletal:     Left hand: Deformity present.       Arms:     Cervical back: Normal range of motion and neck supple. No rigidity or tenderness.     Right lower leg: No edema.     Left lower leg: No edema.  Lymphadenopathy:     Cervical: No cervical adenopathy.  Skin:    General: Skin is warm and dry.  Neurological:     Mental Status: He is alert and oriented to person, place, and time.  Psychiatric:        Mood and Affect: Mood normal.        Behavior: Behavior normal.     Assessment & Plan:   Problem List Items  Addressed This Visit      Musculoskeletal and Integument   Tenosynovitis, de Quervain   Relevant Orders   Ambulatory referral to Sports Medicine     Other   Erectile dysfunction   Relevant Medications   sildenafil (REVATIO) 20 MG tablet   Other Relevant Orders  Comprehensive metabolic panel   LDL cholesterol, direct   Lipid panel   PSA   Elevated BP without diagnosis of hypertension - Primary   Relevant Orders   Comprehensive metabolic panel   Healthcare maintenance   Relevant Orders   CBC   Comprehensive metabolic panel   LDL cholesterol, direct   HIV Antibody (routine testing w rflx)   Lipid panel   PSA   Urinalysis, Routine w reflex microscopic   TSH   Hepatitis C antibody   Ambulatory referral to Gastroenterology      Outpatient Encounter Medications as of 04/12/2019  Medication Sig  . ibuprofen (ADVIL) 800 MG tablet Take 800 mg by mouth as needed.  . methocarbamol (ROBAXIN) 750 MG tablet Take 750 tablets by mouth as needed.  Marland Kitchen albuterol (PROVENTIL HFA;VENTOLIN HFA) 108 (90 Base) MCG/ACT inhaler Inhale 2 puffs into the lungs every 4 (four) hours as needed for wheezing or shortness of breath. (Patient not taking: Reported on 04/12/2019)  . azithromycin (ZITHROMAX) 250 MG tablet Take 1 tablet (250 mg total) by mouth daily. Take first 2 tablets together, then 1 every day until finished. (Patient not taking: Reported on 04/12/2019)  . benzonatate (TESSALON) 100 MG capsule Take 2 capsules (200 mg total) by mouth 3 (three) times daily as needed for cough. (Patient not taking: Reported on 04/12/2019)  . methylPREDNISolone (MEDROL DOSEPAK) 4 MG TBPK tablet Take 6-5-4-3-2-1 po qd (Patient not taking: Reported on 04/12/2019)  . sildenafil (REVATIO) 20 MG tablet May take one to three daily 45 minutes prior as needed.   No facility-administered encounter medications on file as of 04/12/2019.    Follow-up: Return in about 3 months (around 07/10/2019), or check and record bps.   Patient was  given information on health maintenance and disease prevention.  He was also given information on preventing high blood pressure and erectile dysfunction.  He will check and record his blood pressures weekly over the next 3 months and follow-up with a list for me.  Discussed using Revatio for his ED.  Libby Maw, MD

## 2019-04-13 LAB — HIV ANTIBODY (ROUTINE TESTING W REFLEX): HIV 1&2 Ab, 4th Generation: NONREACTIVE

## 2019-04-13 LAB — HEPATITIS C ANTIBODY
Hepatitis C Ab: NONREACTIVE
SIGNAL TO CUT-OFF: 0.01 (ref <1.00)

## 2019-04-14 ENCOUNTER — Ambulatory Visit: Payer: Managed Care, Other (non HMO) | Admitting: Pediatrics

## 2019-04-14 ENCOUNTER — Other Ambulatory Visit: Payer: Self-pay

## 2019-04-14 VITALS — BP 152/88 | Ht 72.0 in | Wt 188.0 lb

## 2019-04-14 DIAGNOSIS — M24549 Contracture, unspecified hand: Secondary | ICD-10-CM

## 2019-04-14 NOTE — Patient Instructions (Signed)
Key points about Dupuytren's contracture  Dupuytren's contracture is an abnormal thickening of the skin in the palm of the hand. The skin may develop into a hard lump. Over time it can cause 1 or more fingers to curl (contract) or pull in toward the palm. You may not be able to use your hand for certain things. In many cases, both hands are affected. There is no cure, but treatment can improve symptoms.    Treatments for Dupuytren's contracture may include:  Surgery. This is the most common treatment used for advanced cases. It may be done when you have limited use of your hand. During Dupuytren's contracture surgery, the surgeon makes a cut (incision) in your hand and takes out the thickened tissue. This can improve the mobility of your fingers. Some people have contractures return. They may need surgery again.  Steroid shot (injection). If a lump is painful, a steroid injection may help ease the pain. In some cases, it may stop your condition from getting worse. You may need repeated injections.  Radiation therapy. This treatment is not as common in the [place] Low energy X-rays are directed at the nodules. This works best in the early stage of the disease. It can soften the nodules and help keep contractions from happening.  Enzyme injection. This is a newer, less invasive procedure done by specially trained surgeons. Your doctor injects a medicine into the area to numb the hand. Then the enzyme is injected into the lump of tissue. Over several hours, the enzyme breaks down and dissolves the tough bands. This lets the fingers straighten when the cord is snapped by the surgeon, usually the next day.  Needle aponeurotomy. This is another newer, less invasive procedure. Medicine is injected into the area to numb the hand. The surgeon uses a needle to divide the diseased tissue. No incision is made.

## 2019-04-14 NOTE — Progress Notes (Signed)
  Chad Cantrell - 59 y.o. male MRN [id number]  Date of birth: 1960-12-27  SUBJECTIVE:   CC: left hand nodule   59 yo male presenting with nodule on palm of left hand. He noticed it first 7-8 months ago and it has been growing over time. He cannot fully flatten is palm on surface as his middle finger is unable to fully extend. He is right handed so this does not bother him too much. He works in Production designer, theatre/television/film.   Recently had rotator cuff surgery at [facility] in November 2020. Currently in rehab for that and is out of work.   ROS: No unexpected weight loss, fever, chills, swelling, instability, muscle pain, numbness/tingling, redness, otherwise see HPI   PMHx - Updated and reviewed.  Contributory factors include: Negative PSHx - Updated and reviewed.  Contributory factors include:  Negative FHx - Updated and reviewed.  Contributory factors include:  Negative Social Hx - Updated and reviewed. Contributory factors include: Negative Medications - reviewed    PHYSICAL EXAM:  VS: BP:(!) 152/88  HR: bpm  TEMP: ( )  RESP:   HT:6' (182.9 cm)   WT:188 lb (85.3 kg)  BMI:25.49 PHYSICAL EXAM: Gen: NAD, alert, cooperative with exam, well-appearing HEENT: clear conjunctiva,  CV:  no edema, capillary refill brisk, normal rate Resp: non-labored Skin: no rashes, normal turgor  Neuro: no gross deficits.  Psych:  alert and oriented   Left Hand: Inspection: contracture of 3rd digit in palmar surface with nodule in mid palm. 3rd digit unable to fully extend Palpation: mildly tender over nodule overlying 3rd flexor tendon ROM: Full ROM of the digits and wrist. Fully able to extend and flex finger. Strength: 5/5 strength in the forearm, wrist and interosseus muscles Neurovascular: NV intact  Limited ultrasound of left hand with showing hypoechoic nodule superficial to tendon. Tendon intact.   ASSESSMENT & PLAN:   Dupuytren's contracture of 3rd flexor tendon on left hand. Patient is right handed  and it does not bother him significantly in daily tasks. Would not like to try intervention at this time, just wanted to know the diagnosis. Discussed various options he can try if he changes his mind, including steroid injections, collagen injections, surgery. Return as needed.  I was the preceptor for this visit and available for immediate consultation Marsa Aris, DO

## 2019-04-15 ENCOUNTER — Encounter: Payer: Self-pay | Admitting: Pediatrics

## 2019-04-22 ENCOUNTER — Telehealth: Payer: Self-pay | Admitting: Family Medicine

## 2019-04-22 NOTE — Telephone Encounter (Signed)
Patient is returning the call. CB is [phone]

## 2019-04-27 ENCOUNTER — Ambulatory Visit (INDEPENDENT_AMBULATORY_CARE_PROVIDER_SITE_OTHER): Payer: Managed Care, Other (non HMO) | Admitting: Family Medicine

## 2019-04-27 ENCOUNTER — Encounter: Payer: Self-pay | Admitting: Family Medicine

## 2019-04-27 VITALS — Ht 72.0 in | Wt 189.0 lb

## 2019-04-27 DIAGNOSIS — R7989 Other specified abnormal findings of blood chemistry: Secondary | ICD-10-CM | POA: Diagnosis not present

## 2019-04-27 DIAGNOSIS — K760 Fatty (change of) liver, not elsewhere classified: Secondary | ICD-10-CM | POA: Insufficient documentation

## 2019-04-27 DIAGNOSIS — R972 Elevated prostate specific antigen [PSA]: Secondary | ICD-10-CM | POA: Diagnosis not present

## 2019-04-27 DIAGNOSIS — R17 Unspecified jaundice: Secondary | ICD-10-CM | POA: Diagnosis not present

## 2019-04-27 DIAGNOSIS — E781 Pure hyperglyceridemia: Secondary | ICD-10-CM

## 2019-04-27 MED ORDER — FENOFIBRATE 145 MG PO TABS: 145.0000 mg | ORAL_TABLET | Freq: Every day | ORAL | 5 refills | Status: DC

## 2019-04-27 NOTE — Progress Notes (Signed)
Established Patient Office Visit  Subjective:  Patient ID: Chad Cantrell, male    DOB: Oct 25, 1960  Age: 59 y.o. MRN: [id number]  CC:  Chief Complaint  Patient presents with  . Follow-up    follow up on labs, discuss lab results.     HPI Chad Cantrell presents for for discussion of his blood work.  He has no history of hypertriglyceridemia he says and was fasting for his blood work.  Assures me that he consumes only 3 beers weekly.  He has an occasional cocktail but that is rare for him.  Has no history of elevated PSA.  He has no voiding symptoms including hesitancy decreased force of stream or nocturia.  Past Medical History:  Diagnosis Date  . Cervical herniated disc     Past Surgical History:  Procedure Laterality Date  . SHOULDER FUSION Left 02/02/2019    Family History  Problem Relation Age of Onset  . Kidney disease Mother     Social History   Socioeconomic History  . Marital status: Married    Spouse name: Not on file  . Number of children: Not on file  . Years of education: Not on file  . Highest education level: Not on file  Occupational History  . Not on file  Tobacco Use  . Smoking status: Former Smoker    Types: Cigarettes  . Smokeless tobacco: Never Used  Substance and Sexual Activity  . Alcohol use: Yes    Alcohol/week: 1.0 - 2.0 standard drinks    Types: 1 - 2 Cans of beer per week    Comment: social  . Drug use: Yes    Types: Marijuana  . Sexual activity: Not on file  Other Topics Concern  . Not on file  Social History Narrative  . Not on file   Social Determinants of Health   Financial Resource Strain:   . Difficulty of Paying Living Expenses: Not on file  Food Insecurity:   . Worried About Programme researcher, broadcasting/film/video in the Last Year: Not on file  . Ran Out of Food in the Last Year: Not on file  Transportation Needs:   . Lack of Transportation (Medical): Not on file  . Lack of Transportation (Non-Medical): Not on file  Physical  Activity:   . Days of Exercise per Week: Not on file  . Minutes of Exercise per Session: Not on file  Stress:   . Feeling of Stress : Not on file  Social Connections:   . Frequency of Communication with Friends and Family: Not on file  . Frequency of Social Gatherings with Friends and Family: Not on file  . Attends Religious Services: Not on file  . Active Member of Clubs or Organizations: Not on file  . Attends Banker Meetings: Not on file  . Marital Status: Not on file  Intimate Partner Violence:   . Fear of Current or Ex-Partner: Not on file  . Emotionally Abused: Not on file  . Physically Abused: Not on file  . Sexually Abused: Not on file    Outpatient Medications Prior to Visit  Medication Sig Dispense Refill  . ibuprofen (ADVIL) 800 MG tablet Take 800 mg by mouth as needed.    . methocarbamol (ROBAXIN) 750 MG tablet Take 750 tablets by mouth as needed.    . sildenafil (REVATIO) 20 MG tablet May take one to three daily 45 minutes prior as needed. 25 tablet 0  . albuterol (PROVENTIL HFA;VENTOLIN HFA) 108 (90 Base)  MCG/ACT inhaler Inhale 2 puffs into the lungs every 4 (four) hours as needed for wheezing or shortness of breath. (Patient not taking: Reported on 04/12/2019) 1 Inhaler 0  . azithromycin (ZITHROMAX) 250 MG tablet Take 1 tablet (250 mg total) by mouth daily. Take first 2 tablets together, then 1 every day until finished. (Patient not taking: Reported on 04/12/2019) 6 tablet 0  . benzonatate (TESSALON) 100 MG capsule Take 2 capsules (200 mg total) by mouth 3 (three) times daily as needed for cough. (Patient not taking: Reported on 04/12/2019) 30 capsule 0  . methylPREDNISolone (MEDROL DOSEPAK) 4 MG TBPK tablet Take 6-5-4-3-2-1 po qd (Patient not taking: Reported on 04/12/2019) 21 tablet 0   No facility-administered medications prior to visit.    No Known Allergies  ROS Review of Systems  Constitutional: Negative.   Respiratory: Negative.   Cardiovascular:  Negative.   Gastrointestinal: Negative.   Genitourinary: Negative.   Psychiatric/Behavioral: Negative.       Objective:    Physical Exam  Constitutional: He is oriented to person, place, and time. No distress.  Pulmonary/Chest: Effort normal.  Neurological: He is alert and oriented to person, place, and time.  Psychiatric: He has a normal mood and affect. His behavior is normal.    Ht 6' (1.829 m)   Wt 189 lb (85.7 kg)   BMI 25.63 kg/m  Wt Readings from Last 3 Encounters:  04/27/19 189 lb (85.7 kg)  04/14/19 188 lb (85.3 kg)  04/12/19 188 lb 12.8 oz (85.6 kg)     Health Maintenance Due  Topic Date Due  . COLONOSCOPY  11/01/2009    There are no preventive care reminders to display for this patient.  Lab Results  Component Value Date   TSH 1.50 04/12/2019   Lab Results  Component Value Date   WBC 8.2 04/12/2019   HGB 15.3 04/12/2019   HCT 44.9 04/12/2019   MCV 89.0 04/12/2019   PLT 258.0 04/12/2019   Lab Results  Component Value Date   NA 134 (L) 04/12/2019   K 3.8 04/12/2019   CO2 21 04/12/2019   GLUCOSE 92 04/12/2019   BUN 12 04/12/2019   CREATININE 1.11 04/12/2019   BILITOT 1.4 (H) 04/12/2019   ALKPHOS 107 04/12/2019   AST 119 (H) 04/12/2019   ALT 110 (H) 04/12/2019   PROT 7.0 04/12/2019   ALBUMIN 4.7 04/12/2019   CALCIUM 9.8 04/12/2019   GFR 81.92 04/12/2019   Lab Results  Component Value Date   CHOL 341 (H) 04/12/2019   Lab Results  Component Value Date   HDL 31.10 (L) 04/12/2019   No results found for: [facility] Lab Results  Component Value Date   TRIG (H) 04/12/2019    1709.0 Triglyceride is over 400; calculations on Lipids are invalid.   Lab Results  Component Value Date   CHOLHDL 11 04/12/2019   No results found for: HGBA1C    Assessment & Plan:   Problem List Items Addressed This Visit      Other   Total bilirubin, elevated   Relevant Orders   Amylase   US Abdomen Complete   Elevated LFTs   Relevant Orders    Hepatitis B Surface AntiGEN   Hepatitis B surface antibody,qualitative   Amylase   US Abdomen Complete   Hypertriglyceridemia   Relevant Medications   fenofibrate (TRICOR) 145 MG tablet   Elevated PSA - Primary      Meds ordered this encounter  Medications  . fenofibrate (TRICOR) 145 MG  tablet    Sig: Take 1 tablet (145 mg total) by mouth daily.    Dispense:  30 tablet    Refill:  5    Follow-up: Return for scheduled appointment in May..   Patient will start fenofibrate.  Will go for ultrasound of his abdomen.  Return for above ordered blood work and again in May for recheck of his PSA. Libby Maw, MD   Virtual Visit via Video Note  I connected with Chad Cantrell on 04/27/19 at 10:00 AM EST by a video enabled telemedicine application and verified that I am speaking with the correct person using two identifiers.  Location: Patient: work alone Provider:    I discussed the limitations of evaluation and management by telemedicine and the availability of in person appointments. The patient expressed understanding and agreed to proceed.  History of Present Illness:    Observations/Objective:   Assessment and Plan:   Follow Up Instructions:    I discussed the assessment and treatment plan with the patient. The patient was provided an opportunity to ask questions and all were answered. The patient agreed with the plan and demonstrated an understanding of the instructions.   The patient was advised to call back or seek an in-person evaluation if the symptoms worsen or if the condition fails to improve as anticipated.  I provided 22 minutes of non-face-to-face time during this encounter.   Libby Maw, MD   Interactive video and audio telecommunications were attempted between myself and the patient. However they failed due to the patient having technical difficulties or not having access to video capability. We continued and completed with audio  only.

## 2019-05-05 ENCOUNTER — Ambulatory Visit
Admission: RE | Admit: 2019-05-05 | Discharge: 2019-05-05 | Disposition: A | Discharge status: Home / Self Care | Payer: Managed Care, Other (non HMO) | Source: Ambulatory Visit | Attending: Family Medicine | Admitting: Family Medicine

## 2019-05-05 DIAGNOSIS — R17 Unspecified jaundice: Secondary | ICD-10-CM

## 2019-05-05 DIAGNOSIS — R7989 Other specified abnormal findings of blood chemistry: Secondary | ICD-10-CM

## 2019-05-09 ENCOUNTER — Telehealth: Payer: Self-pay | Admitting: Family Medicine

## 2019-05-09 NOTE — Telephone Encounter (Signed)
Patient is returning the call. CB is [phone] °

## 2019-05-10 NOTE — Telephone Encounter (Signed)
LDM about imaging results/thx dmf

## 2019-05-10 NOTE — Telephone Encounter (Signed)
Pt called back and said it was Marcelino Duster that called him, please call back at your earliest convienence

## 2019-05-11 ENCOUNTER — Encounter: Payer: Self-pay | Admitting: Gastroenterology

## 2019-05-26 ENCOUNTER — Ambulatory Visit: Payer: Managed Care, Other (non HMO) | Admitting: Gastroenterology

## 2019-06-02 ENCOUNTER — Ambulatory Visit: Payer: Managed Care, Other (non HMO) | Attending: Internal Medicine

## 2019-06-02 DIAGNOSIS — Z23 Encounter for immunization: Secondary | ICD-10-CM

## 2019-06-02 NOTE — Progress Notes (Signed)
   Covid-19 Vaccination Clinic  Name:  Rishi Vicario    MRN: [id number] DOB: 1960-06-29  06/02/2019  Mr. Wirt was observed post Covid-19 immunization for 15 minutes without incident. He was provided with Vaccine Information Sheet and instruction to access the V-Safe system.   Mr. Senft was instructed to call 911 with any severe reactions post vaccine: Marland Kitchen Difficulty breathing  . Swelling of face and throat  . A fast heartbeat  . A bad rash all over body  . Dizziness and weakness   Immunizations Administered    Name Date Dose VIS Date Route   Pfizer COVID-19 Vaccine 06/02/2019  1:55 PM 0.3 mL 02/18/2019 Intramuscular   Manufacturer: [employer], [employer]   Lot: [id number]   NDC: [address]-3

## 2019-06-07 ENCOUNTER — Ambulatory Visit: Payer: Managed Care, Other (non HMO) | Admitting: Gastroenterology

## 2019-06-13 ENCOUNTER — Encounter: Payer: Self-pay | Admitting: Family Medicine

## 2019-06-27 ENCOUNTER — Ambulatory Visit: Payer: Managed Care, Other (non HMO) | Attending: Internal Medicine

## 2019-06-27 DIAGNOSIS — Z23 Encounter for immunization: Secondary | ICD-10-CM

## 2019-06-27 NOTE — Progress Notes (Signed)
   Covid-19 Vaccination Clinic  Name:  Khiree Bukhari    MRN: [id number] DOB: 1960/06/25  06/27/2019  Mr. Sapp was observed post Covid-19 immunization for 15 minutes without incident. He was provided with Vaccine Information Sheet and instruction to access the V-Safe system.   Mr. Hartog was instructed to call 911 with any severe reactions post vaccine: Marland Kitchen Difficulty breathing  . Swelling of face and throat  . A fast heartbeat  . A bad rash all over body  . Dizziness and weakness   Immunizations Administered    Name Date Dose VIS Date Route   Pfizer COVID-19 Vaccine 06/27/2019 12:20 PM 0.3 mL 05/04/2018 Intramuscular   Manufacturer: [employer], [employer]   Lot: [id number]   NDC: [address]-4

## 2019-07-11 ENCOUNTER — Ambulatory Visit: Payer: Managed Care, Other (non HMO) | Admitting: Family Medicine

## 2019-07-11 DIAGNOSIS — Z0289 Encounter for other administrative examinations: Secondary | ICD-10-CM

## 2020-01-04 ENCOUNTER — Other Ambulatory Visit: Payer: Self-pay

## 2020-01-04 ENCOUNTER — Telehealth: Payer: Self-pay | Admitting: Family Medicine

## 2020-01-04 NOTE — Telephone Encounter (Signed)
error 

## 2020-01-05 ENCOUNTER — Encounter: Payer: Self-pay | Admitting: Nurse Practitioner

## 2020-01-05 ENCOUNTER — Ambulatory Visit: Payer: Managed Care, Other (non HMO) | Admitting: Nurse Practitioner

## 2020-01-05 VITALS — BP 122/72 | HR 72 | Temp 97.9°F | Ht 72.0 in | Wt 196.2 lb

## 2020-01-05 DIAGNOSIS — E781 Pure hyperglyceridemia: Secondary | ICD-10-CM | POA: Diagnosis not present

## 2020-01-05 MED ORDER — FENOFIBRATE 145 MG PO TABS: 145.0000 mg | ORAL_TABLET | Freq: Every day | ORAL | 3 refills | Status: DC

## 2020-01-05 NOTE — Assessment & Plan Note (Addendum)
Reviewed lab results presented by patient: Trig 349, TC 307, HDL 62, LDL 175, total chol/HDL ratio 5.0, non-HDL 245, fasting glucose 109 He states he only took fenofibrate for 10month. He denies any adverse side effect with medication. Current tobacco use and daily ETOH intake (3-12oz cans of beer). Denies any ABD pain or nausea. [place] ABD indicates fatty liver. Lipid Panel     Component Value Date/Time   CHOL 341 (H) 04/12/2019 1105   TRIG (H) 04/12/2019 1105    1709.0 Triglyceride is over 400; calculations on Lipids are invalid.   HDL 31.10 (L) 04/12/2019 1105   CHOLHDL 11 04/12/2019 1105   LDLDIRECT 64.0 04/12/2019 1105   Resume fenofibrate. Instructions given to patient:It is very important for you to minimize ETOH intake and work to tobacco use cessation. Also need to maintain a DASH diet. limit alcohol intake to no more than 1 drink a day. One drink equals 12 oz of beer, 5 oz of wine, or 1 oz of hard liquor. F/up with pcp

## 2020-01-05 NOTE — Progress Notes (Signed)
Subjective:  Patient ID: Chad Cantrell, male    DOB: 1960-08-28  Age: 59 y.o. MRN: [id number]  CC: Acute Visit (Pt states he is here for a cholesterol check after having some health wellness results at his job. )  HPI  Hypertriglyceridemia Reviewed lab results presented by patient: Trig 349, TC 307, HDL 62, LDL 175, total chol/HDL ratio 5.0, non-HDL 245, fasting glucose 109 He states he only took fenofibrate for 41month. He denies any adverse side effect with medication. Current tobacco use and daily ETOH intake (3-12oz cans of beer). Denies any ABD pain or nausea. [place] ABD indicates fatty liver.  Resume fenofibrate. Instructions given to patient:It is very important for you to minimize ETOH intake and work to tobacco use cessation. Also need to maintain a DASH diet. limit alcohol intake to no more than 1 drink a day. One drink equals 12 oz of beer, 5 oz of wine, or 1 oz of hard liquor. F/up with pcp  Reviewed past Medical, Social and Family history today.  Outpatient Medications Prior to Visit  Medication Sig Dispense Refill  . ibuprofen (ADVIL) 800 MG tablet Take 800 mg by mouth as needed.     Marland Kitchen albuterol (PROVENTIL HFA;VENTOLIN HFA) 108 (90 Base) MCG/ACT inhaler Inhale 2 puffs into the lungs every 4 (four) hours as needed for wheezing or shortness of breath. (Patient not taking: Reported on 04/12/2019) 1 Inhaler 0  . sildenafil (REVATIO) 20 MG tablet May take one to three daily 45 minutes prior as needed. (Patient not taking: Reported on 01/05/2020) 25 tablet 0  . azithromycin (ZITHROMAX) 250 MG tablet Take 1 tablet (250 mg total) by mouth daily. Take first 2 tablets together, then 1 every day until finished. (Patient not taking: Reported on 04/12/2019) 6 tablet 0  . benzonatate (TESSALON) 100 MG capsule Take 2 capsules (200 mg total) by mouth 3 (three) times daily as needed for cough. (Patient not taking: Reported on 04/12/2019) 30 capsule 0  . fenofibrate (TRICOR) 145 MG tablet Take 1  tablet (145 mg total) by mouth daily. (Patient not taking: Reported on 01/05/2020) 30 tablet 5  . methocarbamol (ROBAXIN) 750 MG tablet Take 750 tablets by mouth as needed. (Patient not taking: Reported on 01/05/2020)    . methylPREDNISolone (MEDROL DOSEPAK) 4 MG TBPK tablet Take 6-5-4-3-2-1 po qd (Patient not taking: Reported on 04/12/2019) 21 tablet 0   No facility-administered medications prior to visit.    ROS See HPI  Objective:  BP 122/72 (BP Location: Left Arm, Patient Position: Sitting, Cuff Size: Large)   Pulse 72   Temp 97.9 F (36.6 C) (Temporal)   Ht 6' (1.829 m)   Wt 196 lb 3.2 oz (89 kg)   SpO2 98%   BMI 26.61 kg/m   Physical Exam Cardiovascular:     Pulses: Normal pulses.  Pulmonary:     Effort: Pulmonary effort is normal.  Neurological:     Mental Status: He is alert and oriented to person, place, and time.    Assessment & Plan:  This visit occurred during the SARS-CoV-2 public health emergency.  Safety protocols were in place, including screening questions prior to the visit, additional usage of staff PPE, and extensive cleaning of exam room while observing appropriate contact time as indicated for disinfecting solutions.   [name] was seen today for acute visit.  Diagnoses and all orders for this visit:  Hypertriglyceridemia -     fenofibrate (TRICOR) 145 MG tablet; Take 1 tablet (145 mg total) by mouth  daily.   Problem List Items Addressed This Visit      Other   Hypertriglyceridemia - Primary    Reviewed lab results presented by patient: Trig 349, TC 307, HDL 62, LDL 175, total chol/HDL ratio 5.0, non-HDL 245, fasting glucose 109 He states he only took fenofibrate for 59month. He denies any adverse side effect with medication. Current tobacco use and daily ETOH intake (3-12oz cans of beer). Denies any ABD pain or nausea. [place] ABD indicates fatty liver.  Resume fenofibrate. Instructions given to patient:It is very important for you to minimize ETOH  intake and work to tobacco use cessation. Also need to maintain a DASH diet. limit alcohol intake to no more than 1 drink a day. One drink equals 12 oz of beer, 5 oz of wine, or 1 oz of hard liquor. F/up with pcp      Relevant Medications   fenofibrate (TRICOR) 145 MG tablet      Follow-up: Return for schedule 61months f/up appt with Dr. Doreene Burke.Alysia Penna, NP

## 2020-01-05 NOTE — Patient Instructions (Signed)
Fenofibrate refill sent It is very important for you to minimize ETOH intake and work to tobacco use cessation. Also need to maintain a DASH diet.  limit alcohol intake to no more than 1 drink a day for nonpregnant women and 2 drinks a day for men. One drink equals 12 oz of beer, 5 oz of wine, or 1 oz of hard liquor.  DASH Eating Plan DASH stands for "Dietary Approaches to Stop Hypertension." The DASH eating plan is a healthy eating plan that has been shown to reduce high blood pressure (hypertension). It may also reduce your risk for type 2 diabetes, heart disease, and stroke. The DASH eating plan may also help with weight loss. What are tips for following this plan?  General guidelines  Avoid eating more than 2,300 mg (milligrams) of salt (sodium) a day. If you have hypertension, you may need to reduce your sodium intake to 1,500 mg a day.  Limit alcohol intake to no more than 1 drink a day for nonpregnant women and 2 drinks a day for men. One drink equals 12 oz of beer, 5 oz of wine, or 1 oz of hard liquor.  Work with your health care provider to maintain a healthy body weight or to lose weight. Ask what an ideal weight is for you.  Get at least 30 minutes of exercise that causes your heart to beat faster (aerobic exercise) most days of the week. Activities may include walking, swimming, or biking.  Work with your health care provider or diet and nutrition specialist (dietitian) to adjust your eating plan to your individual calorie needs. Reading food labels   Check food labels for the amount of sodium per serving. Choose foods with less than 5 percent of the Daily Value of sodium. Generally, foods with less than 300 mg of sodium per serving fit into this eating plan.  To find whole grains, look for the word "whole" as the first word in the ingredient list. Shopping  Buy products labeled as "low-sodium" or "no salt added."  Buy fresh foods. Avoid canned foods and premade or frozen  meals. Cooking  Avoid adding salt when cooking. Use salt-free seasonings or herbs instead of table salt or sea salt. Check with your health care provider or pharmacist before using salt substitutes.  Do not fry foods. Cook foods using healthy methods such as baking, boiling, grilling, and broiling instead.  Cook with heart-healthy oils, such as olive, canola, soybean, or sunflower oil. Meal planning  Eat a balanced diet that includes: ? 5 or more servings of fruits and vegetables each day. At each meal, try to fill half of your plate with fruits and vegetables. ? Up to 6-8 servings of whole grains each day. ? Less than 6 oz of lean meat, poultry, or fish each day. A 3-oz serving of meat is about the same size as a deck of cards. One egg equals 1 oz. ? 2 servings of low-fat dairy each day. ? A serving of nuts, seeds, or beans 5 times each week. ? Heart-healthy fats. Healthy fats called Omega-3 fatty acids are found in foods such as flaxseeds and coldwater fish, like sardines, salmon, and mackerel.  Limit how much you eat of the following: ? Canned or prepackaged foods. ? Food that is high in trans fat, such as fried foods. ? Food that is high in saturated fat, such as fatty meat. ? Sweets, desserts, sugary drinks, and other foods with added sugar. ? Full-fat dairy products.  Do  not salt foods before eating.  Try to eat at least 2 vegetarian meals each week.  Eat more home-cooked food and less restaurant, buffet, and fast food.  When eating at a restaurant, ask that your food be prepared with less salt or no salt, if possible. What foods are recommended? The items listed may not be a complete list. Talk with your dietitian about what dietary choices are best for you. Grains Whole-grain or whole-wheat bread. Whole-grain or whole-wheat pasta. Brown rice. Chad Cantrell. Bulgur. Whole-grain and low-sodium cereals. Pita bread. Low-fat, low-sodium crackers. Whole-wheat flour  tortillas. Vegetables Fresh or frozen vegetables (raw, steamed, roasted, or grilled). Low-sodium or reduced-sodium tomato and vegetable juice. Low-sodium or reduced-sodium tomato sauce and tomato paste. Low-sodium or reduced-sodium canned vegetables. Fruits All fresh, dried, or frozen fruit. Canned fruit in natural juice (without added sugar). Meat and other protein foods Skinless chicken or [name]. Ground chicken or [name]. Pork with fat trimmed off. Fish and seafood. Egg whites. Dried beans, peas, or lentils. Unsalted nuts, nut butters, and seeds. Unsalted canned beans. Lean cuts of beef with fat trimmed off. Low-sodium, lean deli meat. Dairy Low-fat (1%) or fat-free (skim) milk. Fat-free, low-fat, or reduced-fat cheeses. Nonfat, low-sodium ricotta or cottage cheese. Low-fat or nonfat yogurt. Low-fat, low-sodium cheese. Fats and oils Soft margarine without trans fats. Vegetable oil. Low-fat, reduced-fat, or light mayonnaise and salad dressings (reduced-sodium). Canola, safflower, olive, soybean, and sunflower oils. Avocado. Seasoning and other foods Herbs. Spices. Seasoning mixes without salt. Unsalted popcorn and pretzels. Fat-free sweets. What foods are not recommended? The items listed may not be a complete list. Talk with your dietitian about what dietary choices are best for you. Grains Baked goods made with fat, such as croissants, muffins, or some breads. Dry pasta or rice meal packs. Vegetables Creamed or fried vegetables. Vegetables in a cheese sauce. Regular canned vegetables (not low-sodium or reduced-sodium). Regular canned tomato sauce and paste (not low-sodium or reduced-sodium). Regular tomato and vegetable juice (not low-sodium or reduced-sodium). Chad Cantrell. Olives. Fruits Canned fruit in a light or heavy syrup. Fried fruit. Fruit in cream or butter sauce. Meat and other protein foods Fatty cuts of meat. Ribs. Fried meat. Chad Cantrell. Sausage. Bologna and other processed lunch meats.  Salami. Fatback. Hotdogs. Bratwurst. Salted nuts and seeds. Canned beans with added salt. Canned or smoked fish. Whole eggs or egg yolks. Chicken or [name] with skin. Dairy Whole or 2% milk, cream, and half-and-half. Whole or full-fat cream cheese. Whole-fat or sweetened yogurt. Full-fat cheese. Nondairy creamers. Whipped toppings. Processed cheese and cheese spreads. Fats and oils Butter. Stick margarine. Lard. Shortening. Ghee. Bacon fat. Tropical oils, such as coconut, palm kernel, or palm oil. Seasoning and other foods Salted popcorn and pretzels. Onion salt, garlic salt, seasoned salt, table salt, and sea salt. Worcestershire sauce. Tartar sauce. Barbecue sauce. Teriyaki sauce. Soy sauce, including reduced-sodium. Steak sauce. Canned and packaged gravies. Fish sauce. Oyster sauce. Cocktail sauce. Horseradish that you find on the shelf. Ketchup. Mustard. Meat flavorings and tenderizers. Bouillon cubes. Hot sauce and Tabasco sauce. Premade or packaged marinades. Premade or packaged taco seasonings. Relishes. Regular salad dressings. Where to find more information:  National Heart, Lung, and [place]: https://wilson-eaton.com/  American Heart Association: www.heart.org Summary  The DASH eating plan is a healthy eating plan that has been shown to reduce high blood pressure (hypertension). It may also reduce your risk for type 2 diabetes, heart disease, and stroke.  With the DASH eating plan, you should limit salt (sodium) intake  to 2,300 mg a day. If you have hypertension, you may need to reduce your sodium intake to 1,500 mg a day.  When on the DASH eating plan, aim to eat more fresh fruits and vegetables, whole grains, lean proteins, low-fat dairy, and heart-healthy fats.  Work with your health care provider or diet and nutrition specialist (dietitian) to adjust your eating plan to your individual calorie needs. This information is not intended to replace advice given to you by your health  care provider. Make sure you discuss any questions you have with your health care provider. Document Revised: 02/06/2017 Document Reviewed: 02/18/2016 Elsevier Patient Education  2020 [employer].

## 2020-05-01 ENCOUNTER — Other Ambulatory Visit: Payer: Self-pay

## 2020-05-02 ENCOUNTER — Ambulatory Visit: Payer: Managed Care, Other (non HMO) | Admitting: Family Medicine

## 2020-05-02 ENCOUNTER — Encounter: Payer: Self-pay | Admitting: Family Medicine

## 2020-05-02 VITALS — BP 154/78 | HR 77 | Temp 97.7°F | Ht 72.0 in | Wt 191.0 lb

## 2020-05-02 DIAGNOSIS — Z23 Encounter for immunization: Secondary | ICD-10-CM

## 2020-05-02 DIAGNOSIS — M5416 Radiculopathy, lumbar region: Secondary | ICD-10-CM | POA: Insufficient documentation

## 2020-05-02 DIAGNOSIS — M5116 Intervertebral disc disorders with radiculopathy, lumbar region: Secondary | ICD-10-CM

## 2020-05-02 MED ORDER — IBUPROFEN 800 MG PO TABS: 800.0000 mg | ORAL_TABLET | Freq: Four times a day (QID) | ORAL | 1 refills | Status: DC | PRN

## 2020-05-02 NOTE — Progress Notes (Signed)
[facility] [address] [place] [address] Dept: [phone] Dept Fax: [phone]  Acute Office Visit  Subjective:    Patient ID: Chad Cantrell, male    DOB: 28-Apr-1960, 60 y.o..   MRN: [id number]  Chief Complaint  Patient presents with  . Back Pain    C/O back pain becoming worse symptoms x 2 weeks.     History of Present Illness:  Patient is in today with a 2 week history of low back pain. He has a remote history of a lumbar radiculopathy that was treated with a steroid injection 13 years ago. This was done in [place], [place] and the records and scans are not currently available. He notes that his current pain is in the same location that he had pain previously. He is now noticing the pain starting to spread into the right buttocks. With his prior episode, his pain eventually spread down into the leg and interfered with his ability to work. He is concerned about not waiting until it got that severe again to intervene. He has not had any recent injury. He denies any fever, weight loss, or bowel/bladder incontinence. He does have a history of a mildly elevated PSA last year.  Past Medical History: Patient Active Problem List   Diagnosis Date Noted  . Lumbar disc disease with radiculopathy 05/02/2020  . Total bilirubin, elevated 04/27/2019  . Nonalcoholic fatty liver disease 04/27/2019  . Hypertriglyceridemia 04/27/2019  . Elevated PSA 04/27/2019  . Erectile dysfunction 04/12/2019  . Elevated BP without diagnosis of hypertension 04/12/2019  . Healthcare maintenance 04/12/2019  . Tenosynovitis, de Quervain 04/12/2019  . Tendinitis of long head of biceps brachii of left shoulder 12/09/2018  . Arthritis of left acromioclavicular joint 11/08/2018   Past Surgical History:  Procedure Laterality Date  . SHOULDER FUSION Left 02/02/2019   Family History  Problem Relation Age of Onset  . Kidney disease Mother    Outpatient  Medications Prior to Visit  Medication Sig Dispense Refill  . fenofibrate (TRICOR) 145 MG tablet Take 1 tablet (145 mg total) by mouth daily. 90 tablet 3  . ibuprofen (ADVIL) 800 MG tablet Take 800 mg by mouth every 6 (six) hours as needed.    Marland Kitchen albuterol (PROVENTIL HFA;VENTOLIN HFA) 108 (90 Base) MCG/ACT inhaler Inhale 2 puffs into the lungs every 4 (four) hours as needed for wheezing or shortness of breath. (Patient not taking: No sig reported) 1 Inhaler 0  . sildenafil (REVATIO) 20 MG tablet May take one to three daily 45 minutes prior as needed. (Patient not taking: No sig reported) 25 tablet 0   No facility-administered medications prior to visit.   No Known Allergies    Objective:   Today's Vitals   05/02/20 1122  BP: (!) 154/78  Pulse: 77  Temp: 97.7 F (36.5 C)  TempSrc: Temporal  SpO2: 93%  Weight: 191 lb (86.6 kg)  Height: 6' (1.829 m)   Body mass index is 25.9 kg/m.   General: Well developed, well nourished. No acute distress. Extremities: Full ROM. Strength 5/5 in LEs. Sensation normal bilaterally. DTR 1+ patellar and Achilles bilaterally.  Back: Straight. Pain over the right paralumbar muscle column and down into the right gluteal area. No swelling noted. Psych: Alert and oriented. Normal mood and affect.  Health Maintenance Due  Topic Date Due  . COLONOSCOPY (Pts 45-97yrs Insurance coverage will need to be confirmed)  Never done     Assessment & Plan:   1.  Lumbar disc disease with radiculopathy Although typically, I would treat with NSAIDs and heat and await imaging studies, due to the history of prior radiculopathy requiring a steroid injection, it seems reasonable to move ahead with imaging. If significant impingement noted, I will refer to neurosurgery to consider a repeat steroid injection. I did remind him of the need to follow-up in April with Dr. Doreene Burke related to his other chronic medical issues, including follow-up of his PSA.  - ibuprofen (ADVIL) 800  MG tablet; Take 1 tablet (800 mg total) by mouth every 6 (six) hours as needed.  Dispense: 30 tablet; Refill: 1 - MR Lumbar Spine Wo Contrast; Future  2. Need for influenza vaccination  - Flu Vaccine QUAD 6+ mos PF IM (Fluarix Quad PF)    Loyola Mast, MD

## 2020-05-03 ENCOUNTER — Other Ambulatory Visit: Payer: Self-pay | Admitting: Family Medicine

## 2020-05-03 DIAGNOSIS — M5116 Intervertebral disc disorders with radiculopathy, lumbar region: Secondary | ICD-10-CM

## 2020-05-03 NOTE — Addendum Note (Signed)
Addended by: Loyola Mast on: 05/03/2020 12:27 PM   Modules accepted: Orders

## 2020-05-07 ENCOUNTER — Telehealth: Payer: Self-pay | Admitting: Family Medicine

## 2020-05-07 ENCOUNTER — Encounter: Payer: Self-pay | Admitting: Family Medicine

## 2020-05-07 NOTE — Progress Notes (Unsigned)
Insurance denies coverage for referral for MRI scan of the lumbar spine at this point. I called and spoke to Chad Cantrell about the insurance denial for his MRI scan. We agreed to monitor his current back issue for 6 week and have him follow-up at that point, if his symptoms persist. He is to follow-up sooner if the pain worsens or he develops new symptoms.

## 2020-05-07 NOTE — Telephone Encounter (Signed)
I called and spoke to Chad Cantrell about the insurance denial for his MRI scan. We agreed to monitor his current back issue for 6 week and have him follow-up at that point, if his symptoms persist. He is to follow-up sooner if the pain worsens or he develops new symptoms.

## 2020-05-08 NOTE — Telephone Encounter (Signed)
Order cancelled and referral closed. Thank you for the update.

## 2020-05-21 ENCOUNTER — Inpatient Hospital Stay: Admission: RE | Admit: 2020-05-21 | Payer: Managed Care, Other (non HMO) | Source: Ambulatory Visit

## 2020-05-21 ENCOUNTER — Other Ambulatory Visit: Payer: Managed Care, Other (non HMO)

## 2020-07-16 ENCOUNTER — Telehealth: Payer: Self-pay | Admitting: Family Medicine

## 2020-07-16 DIAGNOSIS — M5116 Intervertebral disc disorders with radiculopathy, lumbar region: Secondary | ICD-10-CM

## 2020-07-16 NOTE — Telephone Encounter (Signed)
What is the name of the medication? ibuprofen (ADVIL) 800 MG tablet [[id number]]    Have you contacted your pharmacy to request a refill? Yes, pt is needing a refill on this script.   Which pharmacy would you like this sent to? Pharmacy  [facility] [phone] [place], [place] - [address] AT [facility] OF [facility] [address] & [facility] [address]  [address] [place] [address]  Phone:  [phone] Fax:  [phone]  DEA #:  [id number]     Patient notified that their request is being sent to the clinical staff for review and that they should receive a call once it is complete. If they do not receive a call within 72 hours they can check with their pharmacy or our office.

## 2020-07-20 MED ORDER — IBUPROFEN 800 MG PO TABS: 800.0000 mg | ORAL_TABLET | Freq: Four times a day (QID) | ORAL | 0 refills | Status: DC | PRN

## 2020-07-20 NOTE — Telephone Encounter (Signed)
Patient is calling to check the status of his refill request. Please update.

## 2020-07-20 NOTE — Telephone Encounter (Signed)
Refill sent in patient aware  

## 2020-07-26 ENCOUNTER — Other Ambulatory Visit: Payer: Self-pay

## 2020-07-26 ENCOUNTER — Encounter: Payer: Self-pay | Admitting: Family Medicine

## 2020-07-26 ENCOUNTER — Ambulatory Visit: Payer: Managed Care, Other (non HMO) | Admitting: Family Medicine

## 2020-07-26 VITALS — BP 136/80 | HR 84 | Temp 98.9°F | Ht 72.0 in | Wt 185.2 lb

## 2020-07-26 DIAGNOSIS — F1090 Alcohol use, unspecified, uncomplicated: Secondary | ICD-10-CM | POA: Insufficient documentation

## 2020-07-26 DIAGNOSIS — J45909 Unspecified asthma, uncomplicated: Secondary | ICD-10-CM | POA: Insufficient documentation

## 2020-07-26 DIAGNOSIS — Z7289 Other problems related to lifestyle: Secondary | ICD-10-CM

## 2020-07-26 DIAGNOSIS — R059 Cough, unspecified: Secondary | ICD-10-CM

## 2020-07-26 DIAGNOSIS — J4520 Mild intermittent asthma, uncomplicated: Secondary | ICD-10-CM

## 2020-07-26 DIAGNOSIS — Z789 Other specified health status: Secondary | ICD-10-CM | POA: Insufficient documentation

## 2020-07-26 DIAGNOSIS — E781 Pure hyperglyceridemia: Secondary | ICD-10-CM | POA: Diagnosis not present

## 2020-07-26 DIAGNOSIS — M545 Low back pain, unspecified: Secondary | ICD-10-CM

## 2020-07-26 DIAGNOSIS — R972 Elevated prostate specific antigen [PSA]: Secondary | ICD-10-CM

## 2020-07-26 LAB — COMPREHENSIVE METABOLIC PANEL
ALT: 27 U/L (ref 0–53)
AST: 34 U/L (ref 0–37)
Albumin: 4.7 g/dL (ref 3.5–5.2)
Alkaline Phosphatase: 79 U/L (ref 39–117)
BUN: 18 mg/dL (ref 6–23)
CO2: 21 mEq/L (ref 19–32)
Calcium: 9.4 mg/dL (ref 8.4–10.5)
Chloride: 103 mEq/L (ref 96–112)
Creatinine, Ser: 1.04 mg/dL (ref 0.40–1.50)
GFR: 77.92 mL/min (ref >60.00)
Glucose, Bld: 81 mg/dL (ref 70–99)
Potassium: 4.3 mEq/L (ref 3.5–5.1)
Sodium: 136 mEq/L (ref 135–145)
Total Bilirubin: 0.5 mg/dL (ref 0.2–1.2)
Total Protein: 6.7 g/dL (ref 6.0–8.3)

## 2020-07-26 LAB — URINALYSIS, ROUTINE W REFLEX MICROSCOPIC
Bilirubin Urine: NEGATIVE
Hgb urine dipstick: NEGATIVE
Leukocytes,Ua: NEGATIVE
Nitrite: NEGATIVE
RBC / HPF: NONE SEEN (ref 0–?)
Specific Gravity, Urine: 1.02 (ref 1.000–1.030)
Total Protein, Urine: NEGATIVE
Urine Glucose: NEGATIVE
Urobilinogen, UA: 0.2 (ref 0.0–1.0)
pH: 6 (ref 5.0–8.0)

## 2020-07-26 LAB — LIPID PANEL
Cholesterol: 282 mg/dL — ABNORMAL HIGH (ref 0–200)
HDL: 46.4 mg/dL (ref >39.00)
Total CHOL/HDL Ratio: 6
Triglycerides: 510 mg/dL — ABNORMAL HIGH (ref 0.0–149.0)

## 2020-07-26 LAB — PSA: PSA: 3.14 ng/mL (ref 0.10–4.00)

## 2020-07-26 LAB — CBC
HCT: 40.5 % (ref 39.0–52.0)
Hemoglobin: 13.7 g/dL (ref 13.0–17.0)
MCHC: 33.8 g/dL (ref 30.0–36.0)
MCV: 89.7 fl (ref 78.0–100.0)
Platelets: 204 10*3/uL (ref 150.0–400.0)
RBC: 4.52 Mil/uL (ref 4.22–5.81)
RDW: 13.1 % (ref 11.5–15.5)
WBC: 7 10*3/uL (ref 4.0–10.5)

## 2020-07-26 LAB — LDL CHOLESTEROL, DIRECT: Direct LDL: 158 mg/dL

## 2020-07-26 MED ORDER — METHOCARBAMOL 500 MG PO TABS: 500.0000 mg | ORAL_TABLET | Freq: Three times a day (TID) | ORAL | 1 refills | Status: DC | PRN

## 2020-07-26 MED ORDER — ALBUTEROL SULFATE HFA 108 (90 BASE) MCG/ACT IN AERS: 2.0000 | INHALATION_SPRAY | RESPIRATORY_TRACT | 0 refills | Status: AC | PRN

## 2020-07-26 MED ORDER — KETOROLAC TROMETHAMINE 60 MG/2ML IM SOLN
60.0000 mg | Freq: Once | INTRAMUSCULAR | Status: AC
  Administered 2020-07-26: 60 mg via INTRAMUSCULAR

## 2020-07-26 NOTE — Progress Notes (Addendum)
Established Patient Office Visit  Subjective:  Patient ID: Chad Cantrell, male    DOB: [birth year]  Age: 60 y.o. MRN: [id number]  CC:  Chief Complaint  Patient presents with  . Back Pain    Pt c/o lower rt side  back pain, starting  2 months ago worsening.  Pt explains that he can not lay on his back.  Pt has been taking ibuprofen 800mg  everyday and still has pain before the day is over.    HPI Chad Cantrell presents for follow-up of ongoing back pain.  Pain is in the the right upper lower back area.  It is not radiating.  He feels no weakness in his lower extremities.  There is no numbness or change in bowel or bladder functions.  Urine flow is good.  Bowel movements are normal.  He has seen no blood in his stool.  He has a mild cough that is nonproductive without fever.  History of reactive airway disease.  He uses an albuterol inhaler rarely as needed.  He quit smoking 1 year ago.  Assures me that he has been taking his fenofibrate every day.  He is fasting this morning. Has 2-3 beers daily.   Past Medical History:  Diagnosis Date  . Cervical herniated disc     Past Surgical History:  Procedure Laterality Date  . SHOULDER FUSION Left 02/02/2019    Family History  Problem Relation Age of Onset  . Kidney disease Mother     Social History   Socioeconomic History  . Marital status: Married    Spouse name: Not on file  . Number of children: Not on file  . Years of education: Not on file  . Highest education level: Not on file  Occupational History  . Not on file  Tobacco Use  . Smoking status: Former Smoker    Types: Cigarettes  . Smokeless tobacco: Never Used  Vaping Use  . Vaping Use: Never used  Substance and Sexual Activity  . Alcohol use: Yes    Alcohol/week: 1.0 - 2.0 standard drink    Types: 1 - 2 Cans of beer per week    Comment: social  . Drug use: Yes    Types: Marijuana  . Sexual activity: Not on file  Other Topics Concern  . Not on file  Social  History Narrative  . Not on file   Social Determinants of Health   Financial Resource Strain: Not on file  Food Insecurity: Not on file  Transportation Needs: Not on file  Physical Activity: Not on file  Stress: Not on file  Social Connections: Not on file  Intimate Partner Violence: Not on file    Outpatient Medications Prior to Visit  Medication Sig Dispense Refill  . ibuprofen (ADVIL) 800 MG tablet Take 1 tablet (800 mg total) by mouth every 6 (six) hours as needed. 30 tablet 0  . sildenafil (REVATIO) 20 MG tablet May take one to three daily 45 minutes prior as needed. 25 tablet 0  . albuterol (PROVENTIL HFA;VENTOLIN HFA) 108 (90 Base) MCG/ACT inhaler Inhale 2 puffs into the lungs every 4 (four) hours as needed for wheezing or shortness of breath. 1 Inhaler 0  . fenofibrate (TRICOR) 145 MG tablet Take 1 tablet (145 mg total) by mouth daily. 90 tablet 3   No facility-administered medications prior to visit.    No Known Allergies  ROS Review of Systems  Constitutional: Negative for chills, fatigue, fever and unexpected weight change.  HENT: Negative.  Eyes: Negative for photophobia and visual disturbance.  Respiratory: Positive for cough. Negative for shortness of breath and wheezing.   Cardiovascular: Negative.   Gastrointestinal: Negative for abdominal pain, anal bleeding, blood in stool and constipation.  Endocrine: Negative for polyphagia and polyuria.  Genitourinary: Negative for difficulty urinating, frequency and urgency.  Musculoskeletal: Positive for back pain and myalgias.  Skin: Negative for color change and pallor.  Neurological: Negative for weakness and numbness.  Psychiatric/Behavioral: Negative.       Objective:    Physical Exam Vitals and nursing note reviewed.  Constitutional:      General: He is not in acute distress.    Appearance: Normal appearance. He is not ill-appearing, toxic-appearing or diaphoretic.  HENT:     Head: Normocephalic and  atraumatic.     Right Ear: Tympanic membrane, ear canal and external ear normal.     Left Ear: Tympanic membrane, ear canal and external ear normal.     Mouth/Throat:     Mouth: Mucous membranes are moist.     Pharynx: Oropharynx is clear. No oropharyngeal exudate or posterior oropharyngeal erythema.  Eyes:     General: No scleral icterus.       Right eye: No discharge.        Left eye: No discharge.     Extraocular Movements: Extraocular movements intact.     Conjunctiva/sclera: Conjunctivae normal.     Pupils: Pupils are equal, round, and reactive to light.  Neck:     Vascular: No carotid bruit.  Cardiovascular:     Rate and Rhythm: Normal rate and regular rhythm.  Pulmonary:     Effort: Pulmonary effort is normal.     Breath sounds: Normal breath sounds.  Abdominal:     General: Bowel sounds are normal.  Musculoskeletal:     Cervical back: No rigidity or tenderness.     Lumbar back: Tenderness present. No swelling, deformity or bony tenderness. Normal range of motion. Negative right straight leg raise test and negative left straight leg raise test.       Back:  Lymphadenopathy:     Cervical: No cervical adenopathy.  Neurological:     Mental Status: He is alert and oriented to person, place, and time.     Motor: No weakness.     Deep Tendon Reflexes:     Reflex Scores:      Patellar reflexes are 2+ on the right side and 2+ on the left side.      Achilles reflexes are 1+ on the right side and 1+ on the left side.    Comments: Negative dural tension signs.  Psychiatric:        Mood and Affect: Mood normal.        Behavior: Behavior normal.     BP 136/80 (BP Location: Left Arm, Patient Position: Sitting, Cuff Size: Normal)   Pulse 84   Temp 98.9 F (37.2 C) (Oral)   Ht 6' (1.829 m)   Wt 185 lb 3.2 oz (84 kg)   SpO2 94%   BMI 25.12 kg/m  Wt Readings from Last 3 Encounters:  07/26/20 185 lb 3.2 oz (84 kg)  05/02/20 191 lb (86.6 kg)  01/05/20 196 lb 3.2 oz (89 kg)      Health Maintenance Due  Topic Date Due  . COLONOSCOPY (Pts 45-38yrs Insurance coverage will need to be confirmed)  Never done    There are no preventive care reminders to display for this patient.  Lab Results  Component Value Date   TSH 1.50 04/12/2019   Lab Results  Component Value Date   WBC 7.0 07/26/2020   HGB 13.7 07/26/2020   HCT 40.5 07/26/2020   MCV 89.7 07/26/2020   PLT 204.0 07/26/2020   Lab Results  Component Value Date   NA 136 07/26/2020   K 4.3 07/26/2020   CO2 21 07/26/2020   GLUCOSE 81 07/26/2020   BUN 18 07/26/2020   CREATININE 1.04 07/26/2020   BILITOT 0.5 07/26/2020   ALKPHOS 79 07/26/2020   AST 34 07/26/2020   ALT 27 07/26/2020   PROT 6.7 07/26/2020   ALBUMIN 4.7 07/26/2020   CALCIUM 9.4 07/26/2020   GFR 77.92 07/26/2020   Lab Results  Component Value Date   CHOL 282 (H) 07/26/2020   Lab Results  Component Value Date   HDL 46.40 07/26/2020   No results found for: [facility] Lab Results  Component Value Date   TRIG (H) 07/26/2020    510.0 Triglyceride is over 400; calculations on Lipids are invalid.   Lab Results  Component Value Date   CHOLHDL 6 07/26/2020   No results found for: HGBA1C    Assessment & Plan:   Problem List Items Addressed This Visit      Respiratory   Reactive airway disease   Relevant Medications   albuterol (VENTOLIN HFA) 108 (90 Base) MCG/ACT inhaler     Other   Hypertriglyceridemia   Relevant Medications   fenofibrate (TRICOR) 145 MG tablet   Other Relevant Orders   Comprehensive metabolic panel (Completed)   Lipid panel (Completed)   Elevated PSA - Primary   Relevant Orders   PSA (Completed)   Lumbar pain determined by palpation   Relevant Medications   methocarbamol (ROBAXIN) 500 MG tablet   Other Relevant Orders   Urinalysis, Routine w reflex microscopic (Completed)   Ambulatory referral to Sports Medicine   Cough   Relevant Orders   CBC (Completed)   Alcohol use      Meds  ordered this encounter  Medications  . albuterol (VENTOLIN HFA) 108 (90 Base) MCG/ACT inhaler    Sig: Inhale 2 puffs into the lungs every 4 (four) hours as needed for wheezing or shortness of breath.    Dispense:  1 each    Refill:  0  . ketorolac (TORADOL) injection 60 mg  . methocarbamol (ROBAXIN) 500 MG tablet    Sig: Take 1 tablet (500 mg total) by mouth every 8 (eight) hours as needed for muscle spasms.    Dispense:  30 tablet    Refill:  1  . fenofibrate (TRICOR) 145 MG tablet    Sig: Take 1 tablet (145 mg total) by mouth daily.    Dispense:  90 tablet    Refill:  3    Follow-up: Return in about 3 months (around 10/26/2020).  Sports medicine referral for chronic back pain.  Follow-up on elevated LFTs, elevated PSA, elevated triglycerides.  Congratulated him on quitting smoking.  Recommend closer follow-up.  Suggested that we may need an ultrasound of his liver pending today's blood work results.  Mliss Sax, MD

## 2020-07-27 MED ORDER — FENOFIBRATE 145 MG PO TABS: 145.0000 mg | ORAL_TABLET | Freq: Every day | ORAL | 3 refills | Status: AC

## 2020-07-27 NOTE — Addendum Note (Signed)
Addended by: Andrez Grime on: 07/27/2020 07:58 AM   Modules accepted: Orders

## 2020-08-21 ENCOUNTER — Other Ambulatory Visit: Payer: Self-pay | Admitting: Family Medicine

## 2020-08-21 DIAGNOSIS — M5116 Intervertebral disc disorders with radiculopathy, lumbar region: Secondary | ICD-10-CM

## 2020-08-21 MED ORDER — IBUPROFEN 800 MG PO TABS: 800.0000 mg | ORAL_TABLET | Freq: Three times a day (TID) | ORAL | 0 refills | Status: DC | PRN

## 2020-08-21 NOTE — Telephone Encounter (Signed)
Pt requesting refill on ibuprofen (ADVIL) 800 MG tablet and wants it sent to  [facility]

## 2020-08-21 NOTE — Telephone Encounter (Signed)
Refill request for pending medication last OV 07/26/20 last refill 07/20/2020. Okay to refill? Please advise.

## 2020-10-09 ENCOUNTER — Other Ambulatory Visit: Payer: Self-pay | Admitting: Family Medicine

## 2020-10-09 DIAGNOSIS — M5116 Intervertebral disc disorders with radiculopathy, lumbar region: Secondary | ICD-10-CM

## 2020-10-09 NOTE — Telephone Encounter (Signed)
What is the name of the medication? ibuprofen (ADVIL) 800 MG tablet [[id number]]   Have you contacted your pharmacy to request a refill? Pt is needing a refill.   Which pharmacy would you like this sent to? Pharmacy  [facility] DR[facility]ORE [phone] [place], [place] - [address] AT [facility] OF [facility] [address] & [facility] [address]  [address] [place] [address]  Phone:  [phone]  Fax:  [phone]  DEA #:  [id number]     Patient notified that their request is being sent to the clinical staff for review and that they should receive a call once it is complete. If they do not receive a call within 72 hours they can check with their pharmacy or our office.

## 2020-10-09 NOTE — Telephone Encounter (Signed)
Refill request for pending Rx last OV 07/26/2020 last refill 08/21/2020. Please advise

## 2020-10-10 MED ORDER — IBUPROFEN 800 MG PO TABS: 800.0000 mg | ORAL_TABLET | Freq: Three times a day (TID) | ORAL | 0 refills | Status: DC | PRN

## 2020-10-10 NOTE — Telephone Encounter (Signed)
Done

## 2020-11-08 ENCOUNTER — Encounter (HOSPITAL_COMMUNITY): Payer: Self-pay

## 2020-11-08 ENCOUNTER — Ambulatory Visit (HOSPITAL_COMMUNITY)
Admission: EM | Admit: 2020-11-08 | Discharge: 2020-11-08 | Disposition: A | Discharge status: Home / Self Care | Payer: Managed Care, Other (non HMO) | Attending: Physician Assistant | Admitting: Physician Assistant

## 2020-11-08 ENCOUNTER — Ambulatory Visit (INDEPENDENT_AMBULATORY_CARE_PROVIDER_SITE_OTHER): Payer: Managed Care, Other (non HMO)

## 2020-11-08 ENCOUNTER — Other Ambulatory Visit: Payer: Self-pay

## 2020-11-08 DIAGNOSIS — M5136 Other intervertebral disc degeneration, lumbar region: Secondary | ICD-10-CM

## 2020-11-08 DIAGNOSIS — M5442 Lumbago with sciatica, left side: Secondary | ICD-10-CM

## 2020-11-08 DIAGNOSIS — M545 Low back pain, unspecified: Secondary | ICD-10-CM | POA: Diagnosis not present

## 2020-11-08 MED ORDER — PREDNISONE 10 MG (21) PO TBPK: ORAL_TABLET | ORAL | 0 refills | Status: AC

## 2020-11-08 MED ORDER — TIZANIDINE HCL 4 MG PO CAPS: 4.0000 mg | ORAL_CAPSULE | Freq: Three times a day (TID) | ORAL | 0 refills | Status: DC | PRN

## 2020-11-08 NOTE — ED Provider Notes (Signed)
[facility]    CSN: [id number] Arrival date & time: 11/08/20  1232      History   Chief Complaint Chief Complaint  Patient presents with   Back Pain   Leg Pain    HPI Chad Cantrell is a 60 y.o. male.   Patient presents today with a 2-week history of lumbar back pain with radiation into left leg that has significantly worsened in the past 24 hours.  He denies any known injury or increase activity prior to symptom onset.  He denies any previous surgery or injury to back.  He has tried ibuprofen 800 mg without improvement of symptoms.  He has known degenerative disc disease but generally has sciatica involving right leg and so was concerned because symptoms are different today compared to previous episodes of this condition.  He denies any history of malignancy or recent spinal procedure.  He denies any bowel/bladder incontinence, lower extremity weakness, saddle anesthesia.   Past Medical History:  Diagnosis Date   Cervical herniated disc     Patient Active Problem List   Diagnosis Date Noted   Reactive airway disease 07/26/2020   Lumbar pain determined by palpation 07/26/2020   Cough 07/26/2020   Alcohol use 07/26/2020   Lumbar disc disease with radiculopathy 05/02/2020   Total bilirubin, elevated 04/27/2019   Nonalcoholic fatty liver disease 04/27/2019   Hypertriglyceridemia 04/27/2019   Elevated PSA 04/27/2019   Erectile dysfunction 04/12/2019   Elevated BP without diagnosis of hypertension 04/12/2019   Healthcare maintenance 04/12/2019   Tenosynovitis, de Quervain 04/12/2019   Tendinitis of long head of biceps brachii of left shoulder 12/09/2018   Arthritis of left acromioclavicular joint 11/08/2018    Past Surgical History:  Procedure Laterality Date   SHOULDER FUSION Left 02/02/2019       Home Medications    Prior to Admission medications   Medication Sig Start Date End Date Taking? Authorizing Provider  predniSONE (STERAPRED UNI-PAK 21 TAB)  10 MG (21) TBPK tablet As directed 11/08/20  Yes Elora Wolter K, PA-C  tiZANidine (ZANAFLEX) 4 MG capsule Take 1 capsule (4 mg total) by mouth 3 (three) times daily as needed for muscle spasms. 11/08/20  Yes Mekhi Sonn K, PA-C  albuterol (VENTOLIN HFA) 108 (90 Base) MCG/ACT inhaler Inhale 2 puffs into the lungs every 4 (four) hours as needed for wheezing or shortness of breath. 07/26/20   Mliss Sax, MD  fenofibrate (TRICOR) 145 MG tablet Take 1 tablet (145 mg total) by mouth daily. 07/27/20   Mliss Sax, MD  sildenafil (REVATIO) 20 MG tablet May take one to three daily 45 minutes prior as needed. 04/12/19   Mliss Sax, MD    Family History Family History  Problem Relation Age of Onset   Kidney disease Mother     Social History Social History   Tobacco Use   Smoking status: Former    Types: Cigarettes   Smokeless tobacco: Never  Vaping Use   Vaping Use: Never used  Substance Use Topics   Alcohol use: Yes    Alcohol/week: 1.0 - 2.0 standard drink    Types: 1 - 2 Cans of beer per week    Comment: social   Drug use: Yes    Types: Marijuana     Allergies   Patient has no known allergies.   Review of Systems Review of Systems  Constitutional:  Positive for activity change. Negative for appetite change, fatigue and fever.  Respiratory:  Negative for cough  and shortness of breath.   Cardiovascular:  Negative for chest pain.  Gastrointestinal:  Negative for abdominal pain, diarrhea, nausea and vomiting.  Musculoskeletal:  Positive for back pain. Negative for arthralgias and myalgias.  Neurological:  Negative for dizziness, weakness, light-headedness, numbness and headaches.    Physical Exam Triage Vital Signs ED Triage Vitals  Enc Vitals Group     BP 11/08/20 1346 (!) 148/85     Pulse Rate 11/08/20 1346 71     Resp 11/08/20 1344 20     Temp 11/08/20 1344 98.1 F (36.7 C)     Temp Source 11/08/20 1344 Oral     SpO2 11/08/20 1346 97 %      Weight --      Height --      Head Circumference --      Peak Flow --      Pain Score 11/08/20 1343 10     Pain Loc --      Pain Edu? --      Excl. in GC? --    No data found.  Updated Vital Signs BP (!) 148/85 (BP Location: Right Arm)   Pulse 71   Temp 98.1 F (36.7 C) (Oral)   Resp 20   SpO2 97%   Visual Acuity Right Eye Distance:   Left Eye Distance:   Bilateral Distance:    Right Eye Near:   Left Eye Near:    Bilateral Near:     Physical Exam Vitals reviewed.  Constitutional:      General: He is awake.     Appearance: Normal appearance. He is normal weight. He is not ill-appearing.     Comments: Very pleasant male appears stated age in no acute distress sitting comfortably in exam room  HENT:     Head: Normocephalic and atraumatic.  Cardiovascular:     Rate and Rhythm: Normal rate and regular rhythm.     Heart sounds: Normal heart sounds, S1 normal and S2 normal. No murmur heard. Pulmonary:     Effort: Pulmonary effort is normal.     Breath sounds: Normal breath sounds. No stridor. No wheezing, rhonchi or rales.     Comments: Clear to auscultation bilaterally Musculoskeletal:     Cervical back: No tenderness or bony tenderness.     Thoracic back: No tenderness or bony tenderness.     Lumbar back: Tenderness and bony tenderness present. Positive left straight leg raise test. Negative right straight leg raise test.     Comments: Back: Pain percussion of lumbar vertebrae.  Tenderness palpation of left paraspinal muscles.  Positive straight leg raise on left.  Neurological:     Mental Status: He is alert.  Psychiatric:        Behavior: Behavior is cooperative.     UC Treatments / Results  Labs (all labs ordered are listed, but only abnormal results are displayed) Labs Reviewed - No data to display  EKG   Radiology DG Lumbar Spine Complete  Result Date: 11/08/2020 CLINICAL DATA:  Pain EXAM: LUMBAR SPINE - COMPLETE 4+ VIEW COMPARISON:  None. FINDINGS:  Five lumbar type non-rib-bearing vertebral bodies. Vertebral body heights are well-maintained. Minimal anterior wedge deformity of L1. Normal alignment. No evidence of fracture. Mild multilevel degenerative disc disease consisting of osteophyte formation and mild disc space height loss at L4-L5. Mild facet arthropathy of the lower cervical lumbar. Soft tissues are unremarkable IMPRESSION: Mild multilevel degenerative disc disease. Electronically Signed   By: Aliene Altes.D.  On: 11/08/2020 14:35    Procedures Procedures (including critical care time)  Medications Ordered in UC Medications - No data to display  Initial Impression / Assessment and Plan / UC Course  I have reviewed the triage vital signs and the nursing notes.  Pertinent labs & imaging results that were available during my care of the patient were reviewed by me and considered in my medical decision making (see chart for details).      X-ray obtained given pain percussion of vertebrae and worsening pain showed degenerative changes without acute findings.  Patient has failed NSAIDs so we will try prednisone taper.  Patient was instructed not to take NSAIDs with this medication due to risk of GI bleeding.  He was prescribed tizanidine to be used up to 3 times a day as needed with instruction to drive drink alcohol while taking this medication as drowsiness is a common side effect.  Encouraged him to use Tylenol for breakthrough pain as well as conservative treatment measures including heat, rest, stretch.  Encouraged him to follow-up with his primary care as he may need to see neurosurgeon for repeat cortisone injections.  Discussed alarm symptoms that warrant emergent evaluation.  Strict return precautions given to which patient expressed understanding.  Final Clinical Impressions(s) / UC Diagnoses   Final diagnoses:  Acute left-sided low back pain with left-sided sciatica  DDD (degenerative disc disease), lumbar      Discharge Instructions      Your x-ray showed arthritis but was otherwise normal.  We are going to start you on a prednisone taper to help with your pain and inflammation.  Do not take NSAIDs with this medication including aspirin, ibuprofen/Advil, naproxen/Aleve as it can cause stomach bleeding.  I have also prescribed a muscle relaxer known to Zanaflex.  You can take this 3 times a day as needed but this will make you sleepy so not drive or drink alcohol while taking it.  Use heat, rest, stretch for additional symptom relief.  I would recommend you follow-up with neurosurgery as you may require additional interventions such as cortisone injections as we discussed.  If you have any weakness, numbness, difficulty ambulating, going to the bathroom on yourself without noticing it you need to go to the hospital.     ED Prescriptions     Medication Sig Dispense Auth. Provider   tiZANidine (ZANAFLEX) 4 MG capsule Take 1 capsule (4 mg total) by mouth 3 (three) times daily as needed for muscle spasms. 30 capsule Luceil Herrin K, PA-C   predniSONE (STERAPRED UNI-PAK 21 TAB) 10 MG (21) TBPK tablet As directed 21 tablet Ty Oshima K, PA-C      PDMP not reviewed this encounter.   Jeani Hawking, PA-C 11/08/20 1506

## 2020-11-08 NOTE — Discharge Instructions (Addendum)
Your x-ray showed arthritis but was otherwise normal.  We are going to start you on a prednisone taper to help with your pain and inflammation.  Do not take NSAIDs with this medication including aspirin, ibuprofen/Advil, naproxen/Aleve as it can cause stomach bleeding.  I have also prescribed a muscle relaxer known to Zanaflex.  You can take this 3 times a day as needed but this will make you sleepy so not drive or drink alcohol while taking it.  Use heat, rest, stretch for additional symptom relief.  I would recommend you follow-up with neurosurgery as you may require additional interventions such as cortisone injections as we discussed.  If you have any weakness, numbness, difficulty ambulating, going to the bathroom on yourself without noticing it you need to go to the hospital.

## 2020-11-08 NOTE — ED Triage Notes (Signed)
Pt in with c/o lower back pain that radiates down his left leg x 2 weeks but worsened yesterday  Pt took ibuprofen with no relief

## 2020-11-09 ENCOUNTER — Ambulatory Visit: Payer: Managed Care, Other (non HMO) | Admitting: Family Medicine

## 2020-11-09 ENCOUNTER — Encounter: Payer: Self-pay | Admitting: Family Medicine

## 2020-11-09 VITALS — BP 142/68 | HR 92 | Temp 98.6°F | Ht 73.0 in | Wt 177.2 lb

## 2020-11-09 DIAGNOSIS — E781 Pure hyperglyceridemia: Secondary | ICD-10-CM | POA: Diagnosis not present

## 2020-11-09 DIAGNOSIS — Z23 Encounter for immunization: Secondary | ICD-10-CM

## 2020-11-09 DIAGNOSIS — R030 Elevated blood-pressure reading, without diagnosis of hypertension: Secondary | ICD-10-CM | POA: Diagnosis not present

## 2020-11-09 DIAGNOSIS — M5416 Radiculopathy, lumbar region: Secondary | ICD-10-CM | POA: Diagnosis not present

## 2020-11-09 DIAGNOSIS — Z0000 Encounter for general adult medical examination without abnormal findings: Secondary | ICD-10-CM

## 2020-11-09 MED ORDER — GABAPENTIN 400 MG PO CAPS: 400.0000 mg | ORAL_CAPSULE | Freq: Every day | ORAL | 1 refills | Status: DC

## 2020-11-09 MED ORDER — KETOROLAC TROMETHAMINE 60 MG/2ML IM SOLN
60.0000 mg | Freq: Once | INTRAMUSCULAR | Status: AC
  Administered 2020-11-09: 60 mg via INTRAMUSCULAR

## 2020-11-09 MED ORDER — METHOCARBAMOL 500 MG PO TABS: 500.0000 mg | ORAL_TABLET | Freq: Four times a day (QID) | ORAL | 0 refills | Status: AC | PRN

## 2020-11-09 NOTE — Progress Notes (Signed)
Established Patient Office Visit  Subjective:  Patient ID: Chad Cantrell, male    DOB: [birth year]  Age: 60 y.o. MRN: [id number]  CC:  Chief Complaint  Patient presents with   Hospitalization Follow-up    Hospital follow up lower back pains. Refill on medications.     HPI Chad Cantrell presents for ongoing issue with lower back pain.  He complains of pain in the lower back concentrated on the left.  It is currently radiating down the front of his leg to his knee.  He denies change in bowel or bladder functions or saddle paresthesias.  Strength is not affected.  I saw him for this earlier and had referred him to sports medicine.  That appointment never materialized.  He saw a colleague after that and was referred for an MRI.  He tells me that his insurance company refused to pay for the MRI.  He was seen again in the emergency room 2 days ago.  He was prescribed a 6-day Dosepak and Zanaflex.  He had trouble obtaining the Zanaflex he told me.  History of lumbar radiculopathy in the past treated successfully with steroid injections into his lower back.  He is due for follow-up on his hypertriglyceridemia.  He is nonfasting today.  He assures me that he is taking the medication.  Past Medical History:  Diagnosis Date   Cervical herniated disc     Past Surgical History:  Procedure Laterality Date   SHOULDER FUSION Left 02/02/2019    Family History  Problem Relation Age of Onset   Kidney disease Mother     Social History   Socioeconomic History   Marital status: Married    Spouse name: Not on file   Number of children: Not on file   Years of education: Not on file   Highest education level: Not on file  Occupational History   Not on file  Tobacco Use   Smoking status: Former    Types: Cigarettes   Smokeless tobacco: Never  Vaping Use   Vaping Use: Never used  Substance and Sexual Activity   Alcohol use: Yes    Alcohol/week: 1.0 - 2.0 standard drink    Types: 1 - 2 Cans  of beer per week    Comment: social   Drug use: Yes    Types: Marijuana   Sexual activity: Not on file  Other Topics Concern   Not on file  Social History Narrative   Not on file   Social Determinants of Health   Financial Resource Strain: Not on file  Food Insecurity: Not on file  Transportation Needs: Not on file  Physical Activity: Not on file  Stress: Not on file  Social Connections: Not on file  Intimate Partner Violence: Not on file    Outpatient Medications Prior to Visit  Medication Sig Dispense Refill   albuterol (VENTOLIN HFA) 108 (90 Base) MCG/ACT inhaler Inhale 2 puffs into the lungs every 4 (four) hours as needed for wheezing or shortness of breath. 1 each 0   fenofibrate (TRICOR) 145 MG tablet Take 1 tablet (145 mg total) by mouth daily. 90 tablet 3   predniSONE (STERAPRED UNI-PAK 21 TAB) 10 MG (21) TBPK tablet As directed 21 tablet 0   sildenafil (REVATIO) 20 MG tablet May take one to three daily 45 minutes prior as needed. 25 tablet 0   tiZANidine (ZANAFLEX) 4 MG capsule Take 1 capsule (4 mg total) by mouth 3 (three) times daily as needed for muscle spasms.  30 capsule 0   No facility-administered medications prior to visit.    No Known Allergies  ROS Review of Systems  Constitutional:  Negative for diaphoresis, fatigue, fever and unexpected weight change.  HENT: Negative.    Eyes:  Negative for photophobia and visual disturbance.  Respiratory: Negative.    Cardiovascular: Negative.   Gastrointestinal: Negative.   Genitourinary: Negative.   Musculoskeletal:  Positive for back pain and myalgias.  Skin: Negative.   Neurological:  Negative for weakness and numbness.  Psychiatric/Behavioral: Negative.       Objective:    Physical Exam Vitals and nursing note reviewed.  Constitutional:      Appearance: Normal appearance.  HENT:     Head: Normocephalic and atraumatic.     Right Ear: External ear normal.     Left Ear: External ear normal.  Pulmonary:      Effort: Pulmonary effort is normal.  Musculoskeletal:     Lumbar back: Decreased range of motion. Negative right straight leg raise test and negative left straight leg raise test.  Skin:    General: Skin is warm and dry.  Neurological:     Mental Status: He is alert and oriented to person, place, and time.     Motor: Motor function is intact. No weakness.     Deep Tendon Reflexes:     Reflex Scores:      Patellar reflexes are 1+ on the right side and 1+ on the left side.      Achilles reflexes are 1+ on the right side and 1+ on the left side.   BP (!) 142/68 (BP Location: Right Arm, Patient Position: Sitting, Cuff Size: Normal)   Pulse 92   Temp 98.6 F (37 C) (Oral)   Ht 6\' 1"  (1.854 m)   Wt 177 lb 3.2 oz (80.4 kg)   SpO2 97%   BMI 23.38 kg/m  Wt Readings from Last 3 Encounters:  11/09/20 177 lb 3.2 oz (80.4 kg)  07/26/20 185 lb 3.2 oz (84 kg)  05/02/20 191 lb (86.6 kg)     Health Maintenance Due  Topic Date Due   Pneumococcal Vaccine 25-82 Years old (1 - PCV) Never done   COLONOSCOPY (Pts 45-85yrs Insurance coverage will need to be confirmed)  Never done   Zoster Vaccines- Shingrix (1 of 2) Never done    There are no preventive care reminders to display for this patient.  Lab Results  Component Value Date   TSH 1.50 04/12/2019   Lab Results  Component Value Date   WBC 7.0 07/26/2020   HGB 13.7 07/26/2020   HCT 40.5 07/26/2020   MCV 89.7 07/26/2020   PLT 204.0 07/26/2020   Lab Results  Component Value Date   NA 136 07/26/2020   K 4.3 07/26/2020   CO2 21 07/26/2020   GLUCOSE 81 07/26/2020   BUN 18 07/26/2020   CREATININE 1.04 07/26/2020   BILITOT 0.5 07/26/2020   ALKPHOS 79 07/26/2020   AST 34 07/26/2020   ALT 27 07/26/2020   PROT 6.7 07/26/2020   ALBUMIN 4.7 07/26/2020   CALCIUM 9.4 07/26/2020   GFR 77.92 07/26/2020   Lab Results  Component Value Date   CHOL 282 (H) 07/26/2020   Lab Results  Component Value Date   HDL 46.40 07/26/2020    No results found for: [facility] Lab Results  Component Value Date   TRIG (H) 07/26/2020    510.0 Triglyceride is over 400; calculations on Lipids are invalid.   Lab  Results  Component Value Date   CHOLHDL 6 07/26/2020   No results found for: HGBA1C    Assessment & Plan:   Problem List Items Addressed This Visit       Other   Elevated BP without diagnosis of hypertension   Healthcare maintenance   Relevant Orders   Flu Vaccine QUAD 6+ mos PF IM (Fluarix Quad PF) (Completed)   Hypertriglyceridemia   Other Visit Diagnoses     Lumbar radiculopathy    -  Primary   Relevant Medications   ketorolac (TORADOL) injection 60 mg (Completed)   gabapentin (NEURONTIN) 400 MG capsule   methocarbamol (ROBAXIN) 500 MG tablet   Other Relevant Orders   Ambulatory referral to Orthopedic Surgery       Meds ordered this encounter  Medications   ketorolac (TORADOL) injection 60 mg   gabapentin (NEURONTIN) 400 MG capsule    Sig: Take 1 capsule (400 mg total) by mouth at bedtime.    Dispense:  30 capsule    Refill:  1   methocarbamol (ROBAXIN) 500 MG tablet    Sig: Take 1 tablet (500 mg total) by mouth every 6 (six) hours as needed for muscle spasms.    Dispense:  60 tablet    Refill:  0    Follow-up: Return in about 1 month (around 12/09/2020), or Follow up fasting for recheck of blood fat..   Stressed the importance of follow-up for his elevated triglycerides.  He knows that he is at risk for pancreatitis and liver disease.  He believes that a steroid injection into his back would help him most.  I am referring him directly to orthopedic surgery.  At this point his insurance may cover the MRI that is needed at this point.  He will complete the Dosepak.  Have discontinued Zanaflex and play for about Robaxin.  Higher dose gabapentin at night.  Yes and thank yes thank you Mliss Sax, MD

## 2020-12-04 ENCOUNTER — Telehealth: Payer: Self-pay | Admitting: Family Medicine

## 2020-12-04 DIAGNOSIS — M5416 Radiculopathy, lumbar region: Secondary | ICD-10-CM

## 2020-12-05 NOTE — Telephone Encounter (Signed)
/  Patient aware that there is a refill at his pharmacy will not be ready for pick up until 12/07/20

## 2020-12-05 NOTE — Telephone Encounter (Signed)
Called and spoke to patient. All issues resolved. Sw, cma  

## 2020-12-07 ENCOUNTER — Other Ambulatory Visit: Payer: Self-pay | Admitting: Family Medicine

## 2020-12-07 DIAGNOSIS — M5416 Radiculopathy, lumbar region: Secondary | ICD-10-CM

## 2020-12-17 ENCOUNTER — Ambulatory Visit: Payer: Managed Care, Other (non HMO) | Admitting: Family Medicine

## 2020-12-31 ENCOUNTER — Telehealth: Payer: Self-pay

## 2020-12-31 NOTE — Telephone Encounter (Signed)
Pt requesting refill for;  IBUPROFEN (ADVIL) 800 MG TABLET  Pharmacy is [facility] on [address]

## 2020-12-31 NOTE — Telephone Encounter (Signed)
Patient calling for refill on Ibuprofen discontinued 11/08/20 last OV 12/07/20 please advise.

## 2021-01-01 NOTE — Telephone Encounter (Signed)
Called patient to see if he have seen Dr Ethelene Hal, no answer unable to leave message. Will call back later

## 2021-01-02 NOTE — Telephone Encounter (Signed)
Spoke with patient who states that he have seen Dr. Ethelene Hal he had an appointment yesterday. Advised patient that he will need to check with Dr. Ethelene Hal to see if he would refill requested Rx. Patient agrees to check.

## 2021-03-21 DIAGNOSIS — M24542 Contracture, left hand: Secondary | ICD-10-CM | POA: Diagnosis not present

## 2021-03-21 DIAGNOSIS — Z0000 Encounter for general adult medical examination without abnormal findings: Secondary | ICD-10-CM | POA: Diagnosis not present

## 2021-03-21 DIAGNOSIS — Z7189 Other specified counseling: Secondary | ICD-10-CM | POA: Diagnosis not present

## 2021-03-21 DIAGNOSIS — J4520 Mild intermittent asthma, uncomplicated: Secondary | ICD-10-CM | POA: Diagnosis not present

## 2021-03-21 DIAGNOSIS — D170 Benign lipomatous neoplasm of skin and subcutaneous tissue of head, face and neck: Secondary | ICD-10-CM | POA: Diagnosis not present

## 2021-04-03 DIAGNOSIS — M720 Palmar fascial fibromatosis [Dupuytren]: Secondary | ICD-10-CM | POA: Diagnosis not present

## 2021-04-03 DIAGNOSIS — D170 Benign lipomatous neoplasm of skin and subcutaneous tissue of head, face and neck: Secondary | ICD-10-CM | POA: Diagnosis not present

## 2021-04-08 DIAGNOSIS — D170 Benign lipomatous neoplasm of skin and subcutaneous tissue of head, face and neck: Secondary | ICD-10-CM | POA: Diagnosis not present

## 2021-09-05 ENCOUNTER — Telehealth: Payer: Self-pay | Admitting: Family Medicine

## 2021-09-13 NOTE — Telephone Encounter (Signed)
error 

## 2021-10-02 DIAGNOSIS — J45909 Unspecified asthma, uncomplicated: Secondary | ICD-10-CM | POA: Diagnosis not present

## 2021-10-02 DIAGNOSIS — J4520 Mild intermittent asthma, uncomplicated: Secondary | ICD-10-CM | POA: Diagnosis not present

## 2021-10-02 DIAGNOSIS — M5416 Radiculopathy, lumbar region: Secondary | ICD-10-CM | POA: Diagnosis not present

## 2021-10-02 DIAGNOSIS — E790 Hyperuricemia without signs of inflammatory arthritis and tophaceous disease: Secondary | ICD-10-CM | POA: Diagnosis not present

## 2021-10-02 DIAGNOSIS — R030 Elevated blood-pressure reading, without diagnosis of hypertension: Secondary | ICD-10-CM | POA: Diagnosis not present

## 2021-10-02 DIAGNOSIS — E781 Pure hyperglyceridemia: Secondary | ICD-10-CM | POA: Diagnosis not present

## 2021-11-01 NOTE — Telephone Encounter (Signed)
Na

## 2022-01-03 DIAGNOSIS — H52223 Regular astigmatism, bilateral: Secondary | ICD-10-CM | POA: Diagnosis not present

## 2022-01-21 NOTE — Progress Notes (Shared)
[facility] - Clinic Note  02/04/2022     CHIEF COMPLAINT Patient presents for No chief complaint on file.   HISTORY OF PRESENT ILLNESS: Chad Cantrell is a 61 y.o. male who presents to the clinic today for:     Referring physician: Mliss Sax, MD [address] [place],  [place] [address]  HISTORICAL INFORMATION:   Selected notes from the MEDICAL RECORD NUMBER Referred by Dr. Nedra Hai:  Ocular Hx- PMH-    CURRENT MEDICATIONS: No current outpatient medications on file. (Ophthalmic Drugs)   No current facility-administered medications for this visit. (Ophthalmic Drugs)   Current Outpatient Medications (Other)  Medication Sig   albuterol (VENTOLIN HFA) 108 (90 Base) MCG/ACT inhaler Inhale 2 puffs into the lungs every 4 (four) hours as needed for wheezing or shortness of breath.   fenofibrate (TRICOR) 145 MG tablet Take 1 tablet (145 mg total) by mouth daily.   gabapentin (NEURONTIN) 400 MG capsule TAKE 1 CAPSULE(400 MG) BY MOUTH AT BEDTIME   methocarbamol (ROBAXIN) 500 MG tablet Take 1 tablet (500 mg total) by mouth every 6 (six) hours as needed for muscle spasms.   predniSONE (STERAPRED UNI-PAK 21 TAB) 10 MG (21) TBPK tablet As directed   sildenafil (REVATIO) 20 MG tablet May take one to three daily 45 minutes prior as needed.   No current facility-administered medications for this visit. (Other)      REVIEW OF SYSTEMS:    ALLERGIES No Known Allergies  PAST MEDICAL HISTORY Past Medical History:  Diagnosis Date   Cervical herniated disc    Past Surgical History:  Procedure Laterality Date   SHOULDER FUSION Left 02/02/2019    FAMILY HISTORY Family History  Problem Relation Age of Onset   Kidney disease Mother     SOCIAL HISTORY Social History   Tobacco Use   Smoking status: Former    Types: Cigarettes   Smokeless tobacco: Never  Vaping Use   Vaping Use: Never used  Substance Use Topics   Alcohol use: Yes     Alcohol/week: 1.0 - 2.0 standard drink of alcohol    Types: 1 - 2 Cans of beer per week    Comment: social   Drug use: Yes    Types: Marijuana         OPHTHALMIC EXAM:  Not recorded     IMAGING AND PROCEDURES  Imaging and Procedures for 02/04/2022           ASSESSMENT/PLAN:  No diagnosis found.  1.  2.  3.  Ophthalmic Meds Ordered this visit:  No orders of the defined types were placed in this encounter.      No follow-ups on file.  There are no Patient Instructions on file for this visit.   Explained the diagnoses, plan, and follow up with the patient and they expressed understanding.  Patient expressed understanding of the importance of proper follow up care.   This document serves as a record of services personally performed by Karie Chimera, MD, PhD. It was created on their behalf by Gerilyn Nestle, COT an ophthalmic technician. The creation of this record is the provider's dictation and/or activities during the visit.    Electronically signed by:  Gerilyn Nestle, COT  11.14.23 8:03 AM  Karie Chimera, M.D., Ph.D. Diseases & Surgery of the Retina and Vitreous [facility]      Abbreviations: M myopia (nearsighted); A astigmatism; H hyperopia (farsighted); P presbyopia; Mrx spectacle prescription;  CTL contact lenses; OD right eye; OS left eye; OU both eyes  XT exotropia; ET esotropia; PEK punctate epithelial keratitis; PEE punctate epithelial erosions; DES dry eye syndrome; MGD meibomian gland dysfunction; ATs artificial tears; PFAT's preservative free artificial tears; NSC nuclear sclerotic cataract; PSC posterior subcapsular cataract; ERM epi-retinal membrane; PVD posterior vitreous detachment; RD retinal detachment; DM diabetes mellitus; DR diabetic retinopathy; NPDR non-proliferative diabetic retinopathy; PDR proliferative diabetic retinopathy; CSME clinically significant macular edema; DME diabetic macular edema;  dbh dot blot hemorrhages; CWS cotton wool spot; POAG primary open angle glaucoma; C/D cup-to-disc ratio; HVF humphrey visual field; GVF goldmann visual field; OCT optical coherence tomography; IOP intraocular pressure; BRVO Branch retinal vein occlusion; CRVO central retinal vein occlusion; CRAO central retinal artery occlusion; BRAO branch retinal artery occlusion; RT retinal tear; SB scleral buckle; PPV pars plana vitrectomy; VH Vitreous hemorrhage; PRP panretinal laser photocoagulation; IVK intravitreal kenalog; VMT vitreomacular traction; MH Macular hole;  NVD neovascularization of the disc; NVE neovascularization elsewhere; AREDS age related eye disease study; ARMD age related macular degeneration; POAG primary open angle glaucoma; EBMD epithelial/anterior basement membrane dystrophy; ACIOL anterior chamber intraocular lens; IOL intraocular lens; PCIOL posterior chamber intraocular lens; Phaco/IOL phacoemulsification with intraocular lens placement; PRK photorefractive keratectomy; LASIK laser assisted in situ keratomileusis; HTN hypertension; DM diabetes mellitus; COPD chronic obstructive pulmonary disease

## 2022-02-03 DIAGNOSIS — R3916 Straining to void: Secondary | ICD-10-CM | POA: Diagnosis not present

## 2022-02-03 DIAGNOSIS — R8290 Unspecified abnormal findings in urine: Secondary | ICD-10-CM | POA: Diagnosis not present

## 2022-02-03 DIAGNOSIS — E785 Hyperlipidemia, unspecified: Secondary | ICD-10-CM | POA: Diagnosis not present

## 2022-02-03 DIAGNOSIS — Z23 Encounter for immunization: Secondary | ICD-10-CM | POA: Diagnosis not present

## 2022-02-03 DIAGNOSIS — M720 Palmar fascial fibromatosis [Dupuytren]: Secondary | ICD-10-CM | POA: Diagnosis not present

## 2022-02-03 DIAGNOSIS — E790 Hyperuricemia without signs of inflammatory arthritis and tophaceous disease: Secondary | ICD-10-CM | POA: Diagnosis not present

## 2022-02-03 DIAGNOSIS — E782 Mixed hyperlipidemia: Secondary | ICD-10-CM | POA: Diagnosis not present

## 2022-02-03 DIAGNOSIS — M5416 Radiculopathy, lumbar region: Secondary | ICD-10-CM | POA: Diagnosis not present

## 2022-02-04 ENCOUNTER — Encounter (INDEPENDENT_AMBULATORY_CARE_PROVIDER_SITE_OTHER): Payer: Self-pay

## 2022-02-04 ENCOUNTER — Encounter (INDEPENDENT_AMBULATORY_CARE_PROVIDER_SITE_OTHER): Payer: Self-pay | Admitting: Ophthalmology

## 2022-02-04 DIAGNOSIS — H3581 Retinal edema: Secondary | ICD-10-CM

## 2022-02-18 DIAGNOSIS — M720 Palmar fascial fibromatosis [Dupuytren]: Secondary | ICD-10-CM | POA: Diagnosis not present

## 2022-03-25 DIAGNOSIS — Z0000 Encounter for general adult medical examination without abnormal findings: Secondary | ICD-10-CM | POA: Diagnosis not present

## 2022-03-25 DIAGNOSIS — E790 Hyperuricemia without signs of inflammatory arthritis and tophaceous disease: Secondary | ICD-10-CM | POA: Diagnosis not present

## 2022-03-25 DIAGNOSIS — Z7189 Other specified counseling: Secondary | ICD-10-CM | POA: Diagnosis not present

## 2022-03-25 DIAGNOSIS — E785 Hyperlipidemia, unspecified: Secondary | ICD-10-CM | POA: Diagnosis not present

## 2022-03-25 DIAGNOSIS — R972 Elevated prostate specific antigen [PSA]: Secondary | ICD-10-CM | POA: Diagnosis not present

## 2022-10-17 ENCOUNTER — Telehealth: Payer: Self-pay | Admitting: Family Medicine

## 2022-10-17 NOTE — Telephone Encounter (Signed)
Patient has not been seen by current PCP in a year. Reached out to the patient to see if they have a new PCP or if they would like to continue care with the provider.  Unable to leave voicemail

## 2023-01-25 IMAGING — DX DG LUMBAR SPINE COMPLETE 4+V
4 series · 4 of 4 positions shown · non-contrast
Comparison: None.

CLINICAL DATA: Pain

EXAM:
LUMBAR SPINE - COMPLETE 4+ VIEW

[l-spine ap]
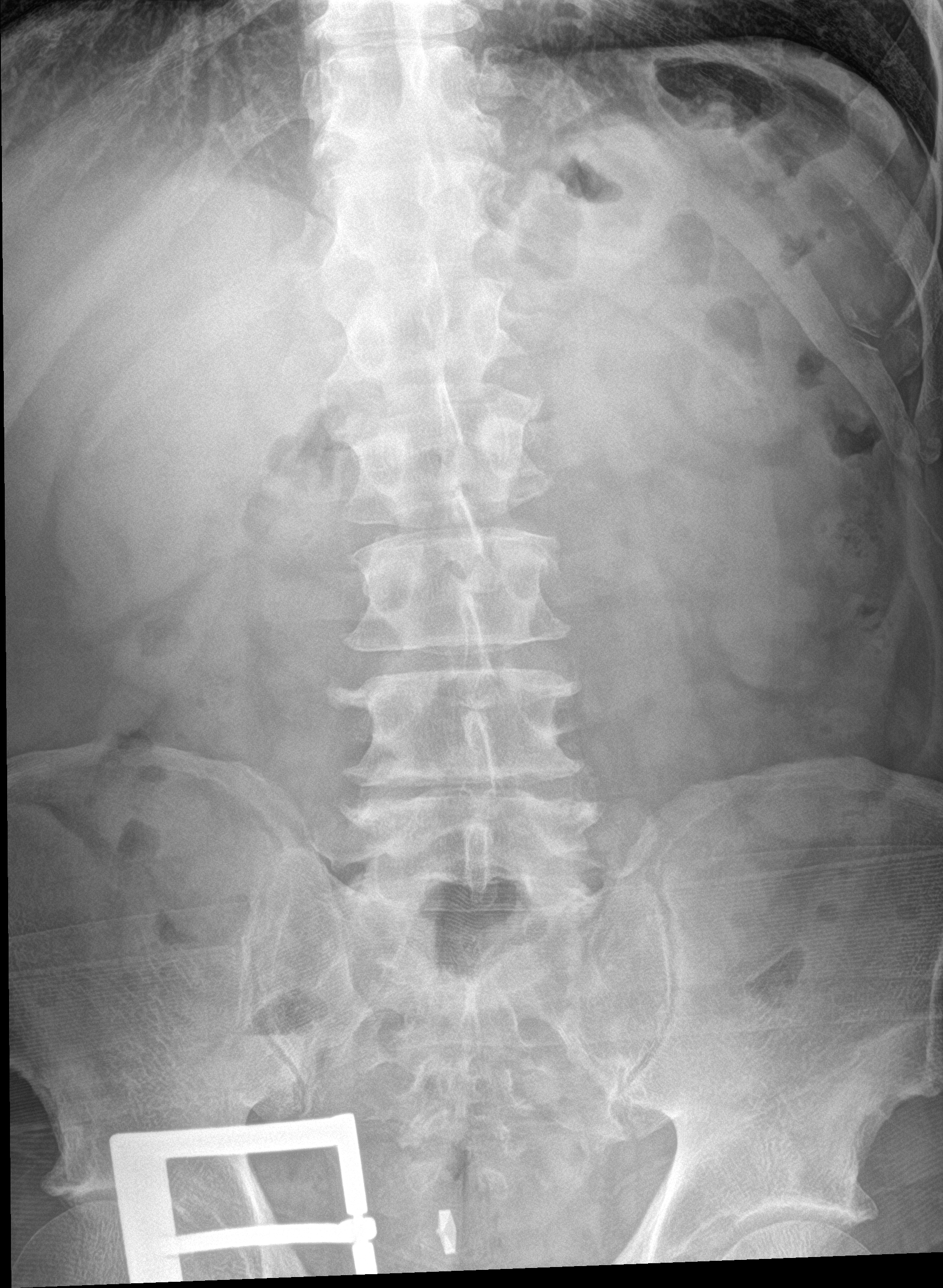

[l-spine obl (1 of 2)]
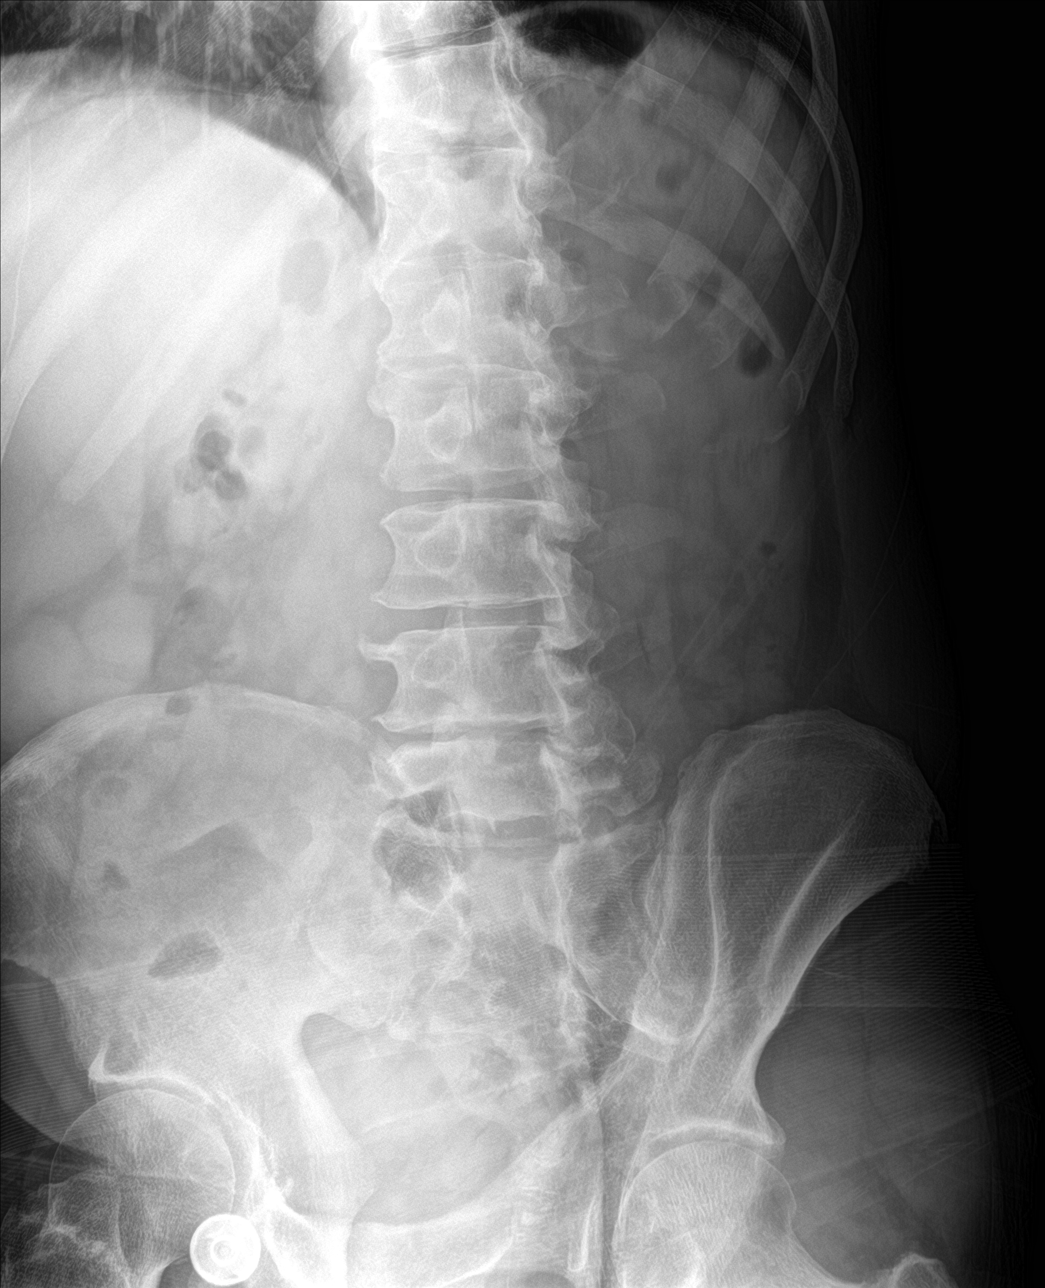

[l-spine obl (2 of 2)]
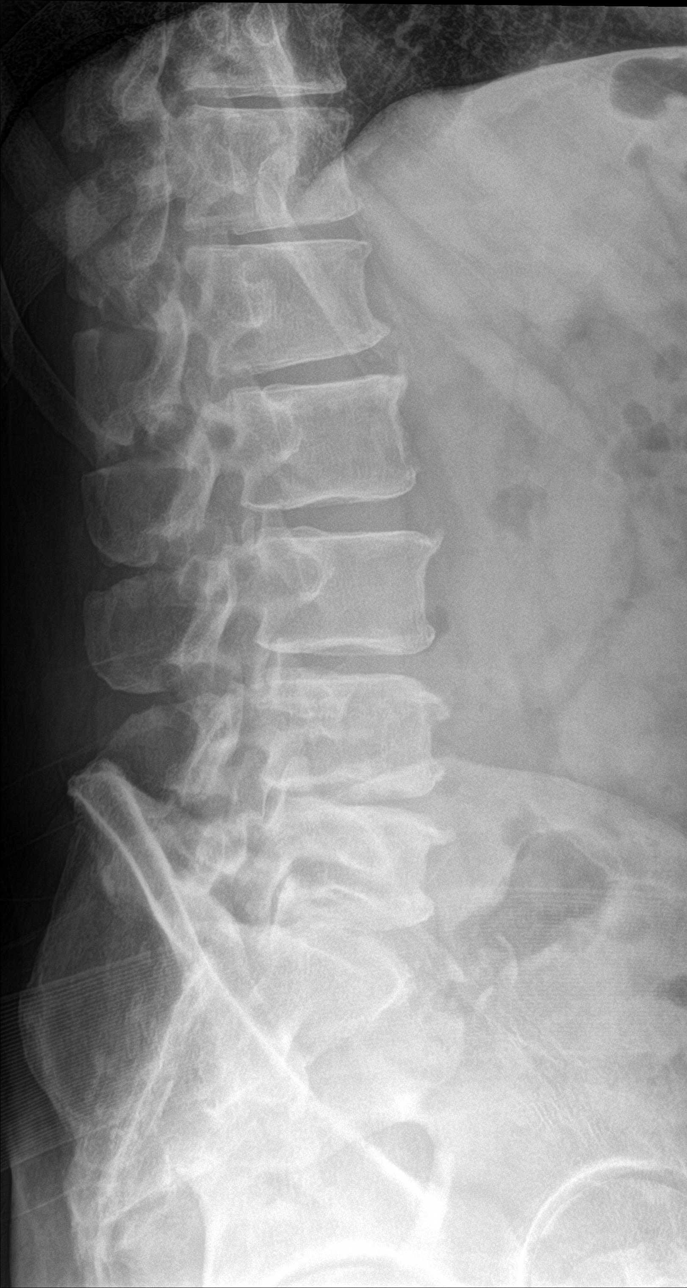

[l-spine lat]
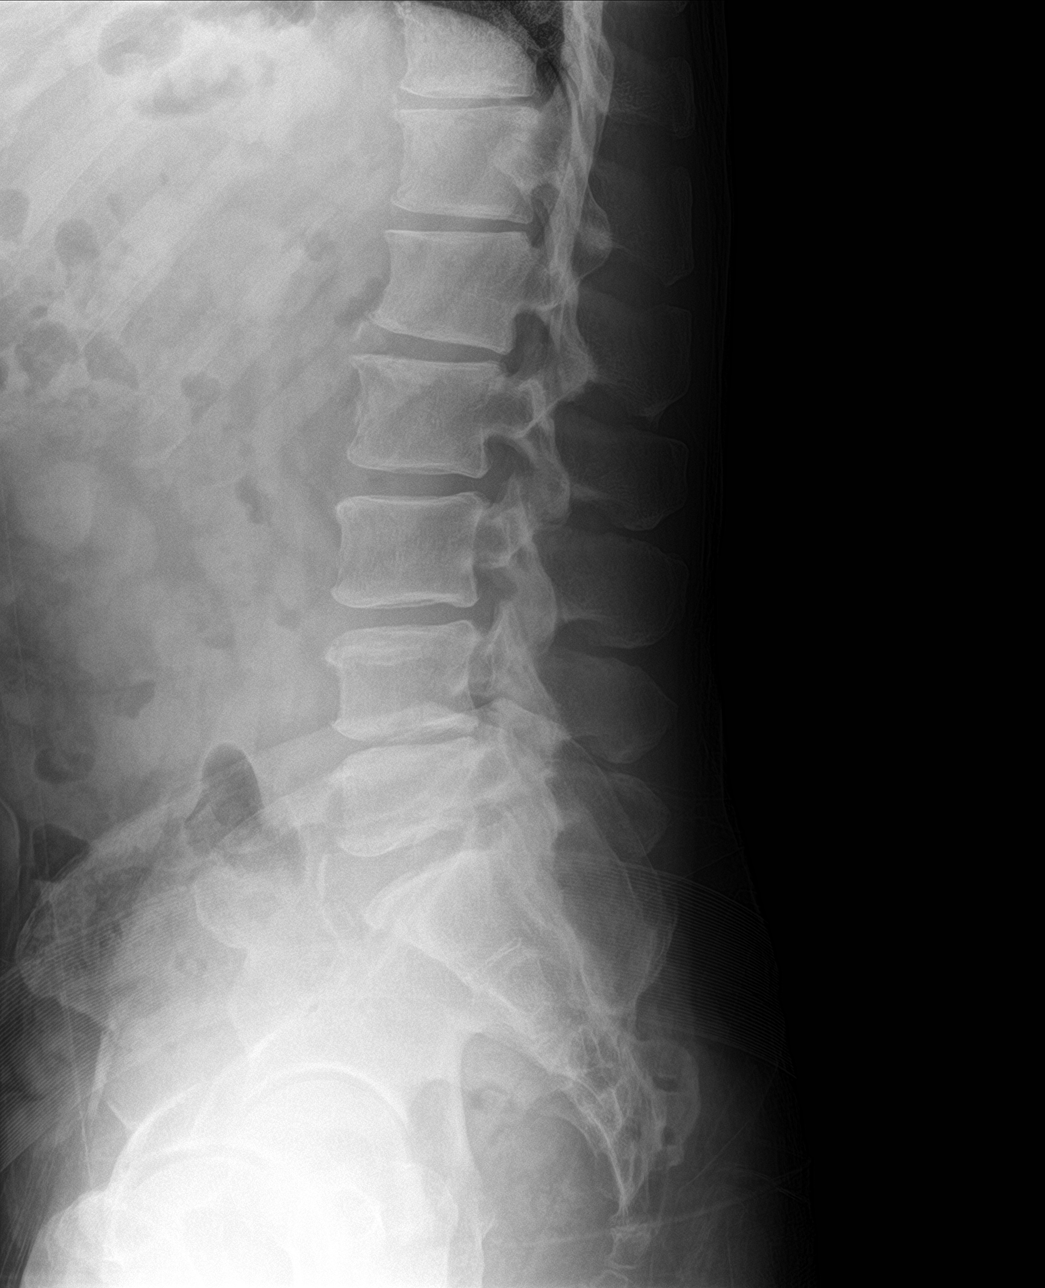

[4 of 4 positions shown; findings below may reference images not displayed]

FINDINGS: Five lumbar type non-rib-bearing vertebral bodies. Vertebral body
heights are well-maintained. Minimal anterior wedge deformity of L1.
Normal alignment. No evidence of fracture. Mild multilevel
degenerative disc disease consisting of osteophyte formation and
mild disc space height loss at L4-L5. Mild facet arthropathy of the
lower cervical lumbar. Soft tissues are unremarkable
IMPRESSION: Mild multilevel degenerative disc disease.

## 2023-02-25 DIAGNOSIS — M069 Rheumatoid arthritis, unspecified: Secondary | ICD-10-CM | POA: Diagnosis not present

## 2023-02-25 DIAGNOSIS — R972 Elevated prostate specific antigen [PSA]: Secondary | ICD-10-CM | POA: Diagnosis not present

## 2023-02-25 DIAGNOSIS — M0609 Rheumatoid arthritis without rheumatoid factor, multiple sites: Secondary | ICD-10-CM | POA: Diagnosis not present

## 2023-02-25 DIAGNOSIS — Z23 Encounter for immunization: Secondary | ICD-10-CM | POA: Diagnosis not present

## 2023-02-25 DIAGNOSIS — E781 Pure hyperglyceridemia: Secondary | ICD-10-CM | POA: Diagnosis not present

## 2023-02-25 DIAGNOSIS — Z79899 Other long term (current) drug therapy: Secondary | ICD-10-CM | POA: Diagnosis not present

## 2023-02-25 DIAGNOSIS — F1021 Alcohol dependence, in remission: Secondary | ICD-10-CM | POA: Diagnosis not present

## 2023-02-25 DIAGNOSIS — M5416 Radiculopathy, lumbar region: Secondary | ICD-10-CM | POA: Diagnosis not present

## 2023-02-25 DIAGNOSIS — E790 Hyperuricemia without signs of inflammatory arthritis and tophaceous disease: Secondary | ICD-10-CM | POA: Diagnosis not present

## 2023-02-25 DIAGNOSIS — E7849 Other hyperlipidemia: Secondary | ICD-10-CM | POA: Diagnosis not present
